# Patient Record
Sex: Female | Born: 1940 | Race: White | Hispanic: No | Marital: Married | Smoking: Never smoker
Health system: Southern US, Community
[De-identification: ages and names within clinical notes are randomized; demographics above are authoritative.]

## PROBLEM LIST (undated history)

## (undated) DIAGNOSIS — K219 Gastro-esophageal reflux disease without esophagitis: Secondary | ICD-10-CM

## (undated) DIAGNOSIS — E785 Hyperlipidemia, unspecified: Secondary | ICD-10-CM

## (undated) DIAGNOSIS — J42 Unspecified chronic bronchitis: Secondary | ICD-10-CM

## (undated) DIAGNOSIS — R519 Headache, unspecified: Secondary | ICD-10-CM

## (undated) DIAGNOSIS — M545 Low back pain, unspecified: Secondary | ICD-10-CM

## (undated) DIAGNOSIS — I00 Rheumatic fever without heart involvement: Secondary | ICD-10-CM

## (undated) DIAGNOSIS — J45909 Unspecified asthma, uncomplicated: Secondary | ICD-10-CM

## (undated) DIAGNOSIS — F419 Anxiety disorder, unspecified: Secondary | ICD-10-CM

## (undated) DIAGNOSIS — R51 Headache: Secondary | ICD-10-CM

## (undated) DIAGNOSIS — M1990 Unspecified osteoarthritis, unspecified site: Secondary | ICD-10-CM

## (undated) DIAGNOSIS — C189 Malignant neoplasm of colon, unspecified: Secondary | ICD-10-CM

## (undated) DIAGNOSIS — G8929 Other chronic pain: Secondary | ICD-10-CM

## (undated) DIAGNOSIS — I10 Essential (primary) hypertension: Secondary | ICD-10-CM

## (undated) HISTORY — DX: Rheumatic fever without heart involvement: I00

## (undated) HISTORY — PX: DILATION AND CURETTAGE OF UTERUS: SHX78

## (undated) HISTORY — DX: Hyperlipidemia, unspecified: E78.5

## (undated) HISTORY — PX: APPENDECTOMY: SHX54

## (undated) HISTORY — PX: TONSILLECTOMY: SUR1361

## (undated) HISTORY — DX: Essential (primary) hypertension: I10

## (undated) HISTORY — PX: COLONOSCOPY: SHX174

---

## 2002-09-21 HISTORY — PX: POLYPECTOMY: SHX149

## 2002-10-27 ENCOUNTER — Encounter (INDEPENDENT_AMBULATORY_CARE_PROVIDER_SITE_OTHER): Payer: Self-pay | Admitting: Specialist

## 2002-10-27 ENCOUNTER — Ambulatory Visit (HOSPITAL_COMMUNITY): Admission: RE | Admit: 2002-10-27 | Discharge: 2002-10-27 | Payer: Self-pay | Admitting: Gastroenterology

## 2003-03-03 ENCOUNTER — Emergency Department (HOSPITAL_COMMUNITY): Admission: EM | Admit: 2003-03-03 | Discharge: 2003-03-03 | Payer: Self-pay | Admitting: Emergency Medicine

## 2005-07-13 ENCOUNTER — Other Ambulatory Visit: Admission: RE | Admit: 2005-07-13 | Discharge: 2005-07-13 | Payer: Self-pay | Admitting: Family Medicine

## 2006-07-16 ENCOUNTER — Other Ambulatory Visit: Admission: RE | Admit: 2006-07-16 | Discharge: 2006-07-16 | Payer: Self-pay | Admitting: Family Medicine

## 2006-09-16 ENCOUNTER — Ambulatory Visit: Payer: Self-pay | Admitting: Gastroenterology

## 2006-09-28 ENCOUNTER — Ambulatory Visit: Payer: Self-pay | Admitting: Gastroenterology

## 2007-07-19 ENCOUNTER — Other Ambulatory Visit: Admission: RE | Admit: 2007-07-19 | Discharge: 2007-07-19 | Payer: Self-pay | Admitting: Family Medicine

## 2008-07-25 ENCOUNTER — Other Ambulatory Visit: Admission: RE | Admit: 2008-07-25 | Discharge: 2008-07-25 | Payer: Self-pay | Admitting: Family Medicine

## 2008-08-01 ENCOUNTER — Encounter: Admission: RE | Admit: 2008-08-01 | Discharge: 2008-08-01 | Payer: Self-pay | Admitting: Family Medicine

## 2008-09-06 ENCOUNTER — Encounter: Admission: RE | Admit: 2008-09-06 | Discharge: 2008-09-06 | Payer: Self-pay | Admitting: Family Medicine

## 2009-10-01 ENCOUNTER — Encounter (INDEPENDENT_AMBULATORY_CARE_PROVIDER_SITE_OTHER): Payer: Self-pay | Admitting: *Deleted

## 2009-10-28 ENCOUNTER — Encounter (INDEPENDENT_AMBULATORY_CARE_PROVIDER_SITE_OTHER): Payer: Self-pay | Admitting: *Deleted

## 2009-12-02 ENCOUNTER — Encounter (INDEPENDENT_AMBULATORY_CARE_PROVIDER_SITE_OTHER): Payer: Self-pay

## 2009-12-03 ENCOUNTER — Ambulatory Visit: Payer: Self-pay | Admitting: Gastroenterology

## 2009-12-17 ENCOUNTER — Ambulatory Visit: Payer: Self-pay | Admitting: Gastroenterology

## 2010-07-08 ENCOUNTER — Encounter: Admission: RE | Admit: 2010-07-08 | Discharge: 2010-07-08 | Payer: Self-pay | Admitting: Family Medicine

## 2010-09-01 ENCOUNTER — Other Ambulatory Visit
Admission: RE | Admit: 2010-09-01 | Discharge: 2010-09-01 | Payer: Self-pay | Source: Home / Self Care | Admitting: Family Medicine

## 2010-10-23 NOTE — Letter (Signed)
Summary: Colonoscopy Letter   Gastroenterology  [address] [place], [place] [address]   Phone: [phone]  Fax: [phone]      October 01, 2009 MRN: [id number]   [facility] [address] [place], [place]  [address]   Dear Ms. Westra,   According to your medical record, it is time for you to schedule a Colonoscopy. The American Cancer Society recommends this procedure as a method to detect early colon cancer. Patients with a family history of colon cancer, or a personal history of colon polyps or inflammatory bowel disease are at increased risk.  This letter has beeen generated based on the recommendations made at the time of your procedure. If you feel that in your particular situation this may no longer apply, please contact our office.  Please call our office at [phone] to schedule this appointment or to update your records at your earliest convenience.  Thank you for cooperating with [place] to provide you with the very best care possible.   Sincerely,  Judie Petit T. Russella Dar, M.D.  [facility] Gastroenterology Division [phone]

## 2010-10-23 NOTE — Procedures (Signed)
Summary: Colonoscopy  Patient: Kathryn Adams Note: All result statuses are Final unless otherwise noted.  Tests: (1) Colonoscopy (COL)   COL Colonoscopy           DONE     [facility]     [address]     [place], [place]  [address]           COLONOSCOPY PROCEDURE REPORT           PATIENT:  [name], [name]  MR#:  [id number]     BIRTHDATE:  09-May-1941, 68 yrs. old  GENDER:  female           ENDOSCOPIST:  Judie Petit T. 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 Screening.           MEDICATIONS:   Fentanyl 100 mcg IV, Versed 12 mg IV           DESCRIPTION OF PROCEDURE:   After the risks benefits and     alternatives of the procedure were thoroughly explained, informed     consent was obtained.  Digital rectal exam was performed and     revealed no abnormalities.   The LB [id number]-[id number] endoscope     was introduced through the anus and advanced to the cecum, which     was identified by both the appendix and ileocecal valve, limited     by a tortuous colon.  The quality of the prep was excellent, using     MoviPrep.  The instrument was then slowly withdrawn as the colon     was fully examined.     <<PROCEDUREIMAGES>>           FINDINGS:  A normal appearing cecum, ileocecal valve, and     appendiceal orifice were identified. The ascending, hepatic     flexure, transverse, splenic flexure, descending, sigmoid colon,     and rectum appeared unremarkable.  Retroflexed views in the rectum     revealed no abnormalities.  The time to cecum =  4.5  minutes. The     scope was then withdrawn (time =  10.75  min) from the patient and     the procedure completed.           COMPLICATIONS:  None           ENDOSCOPIC IMPRESSION:     1) Normal colon           RECOMMENDATIONS:     1) Repeat Colonoscopy in 3  years.           Venita Lick. Russella Dar, MD, [facility]           CC: Juluis Rainier, MD           n.     Rosalie DoctorVenita Lick. Jessamy Torosyan at 12/17/2009 09:04 AM           Merrie Roof, [phone]  Note: An exclamation mark (!) indicates a result that was not dispersed into the flowsheet. Document Creation Date: 12/17/2009 9:05 AM _______________________________________________________________________  (1) Order result status: Final Collection or observation date-time: 12/17/2009 08:57 Requested date-time:  Receipt date-time:  Reported date-time:  Referring Physician:   Ordering Physician: Claudette Head [phone]) Specimen Source:  Source: Kem Parkinson  Filler Order Number: [id number] Lab site:   Appended Document: Colonoscopy    Clinical Lists Changes  Observations: Added new observation of COLONNXTDUE: 11/2012 (12/17/2009 14:50)

## 2010-10-23 NOTE — Miscellaneous (Signed)
Summary: Lec previsit  Clinical Lists Changes  Medications: Added new medication of MOVIPREP 100 GM  SOLR (PEG-KCL-NACL-NASULF-NA ASC-C) As per prep instructions. - Signed Rx of MOVIPREP 100 GM  SOLR (PEG-KCL-NACL-NASULF-NA ASC-C) As per prep instructions.;  #1 x 0;  Signed;  Entered by: Ulis Rias RN;  Authorized by: Meryl Dare MD [facility];  Method used: Electronically to [id number]*, [address] [place], [place]  [address], Ph: [phone], Fax: [phone] Observations: Added new observation of NKA: T (12/03/2009 10:42)    Prescriptions: MOVIPREP 100 GM  SOLR (PEG-KCL-NACL-NASULF-NA ASC-C) As per prep instructions.  #1 x 0   Entered by:   Ulis Rias RN   Authorized by:   Meryl Dare MD [facility]   Signed by:   Ulis Rias RN on 12/03/2009   Method used:   Electronically to        [facility] [phone]* (retail)       [address] [place], [place]  [address]       Ph: [phone]       Fax: [phone]   RxID:   [id number]

## 2010-10-23 NOTE — Letter (Signed)
Summary: Previsit letter  [facility] Gastroenterology  [address] [place], [place] [address]   Phone: [phone]  Fax: [phone]       10/28/2009 MRN: [id number]  [facility] [address] [place], [place]  [address]  Dear Kathryn Adams,  Welcome to the Gastroenterology Division at [facility].    You are scheduled to see a nurse for your pre-procedure visit on 12-03-09 at 11:00a.m. on the 3rd floor at [facility], [address]  We ask that you try to arrive at our office 15 minutes prior to your appointment time to allow for check-in.  Your nurse visit will consist of discussing your medical and surgical history, your immediate family medical history, and your medications.    Please bring a complete list of all your medications or, if you prefer, bring the medication bottles and we will list them.  We will need to be aware of both prescribed and over the counter drugs.  We will need to know exact dosage information as well.  If you are on blood thinners (Coumadin, Plavix, Aggrenox, Ticlid, etc.) please call our office today/prior to your appointment, as we need to consult with your physician about holding your medication.   Please be prepared to read and sign documents such as consent forms, a financial agreement, and acknowledgement forms.  If necessary, and with your consent, a friend or relative is welcome to sit-in on the nurse visit with you.  Please bring your insurance card so that we may make a copy of it.  If your insurance requires a referral to see a specialist, please bring your referral form from your primary care physician.  No co-pay is required for this nurse visit.     If you cannot keep your appointment, please call [phone] to cancel or reschedule prior to your appointment date.  This allows [place] the opportunity to schedule an appointment for another patient in need of care.    Thank you for choosing  Gastroenterology for your  medical needs.  We appreciate the opportunity to care for you.  Please visit [place] at our website  to learn more about our practice.                     Sincerely.                                                                                                                   The Gastroenterology Division

## 2010-10-23 NOTE — Letter (Signed)
Summary: [facility] Instructions  [facility]  [address] [place], [place] [address]   Phone: [phone]  Fax: [phone]       Kathryn Adams    1941/02/04    MRN: [id number]        Procedure Day /Date:  12/17/09       Arrival Time: 7:30am     Procedure Time:  8:30am     Location of Procedure:                    _x _  [facility] (4th Floor)   PREPARATION FOR COLONOSCOPY WITH MOVIPREP   Starting 5 days prior to your procedure _ 3/24/11_ do not eat nuts, seeds, popcorn, corn, beans, peas,  salads, or any raw vegetables.  Do not take any fiber supplements (e.g. Metamucil, Citrucel, and Benefiber).  THE DAY BEFORE YOUR PROCEDURE         DATE:  12/16/09  DAY:  Monday  1.  Drink clear liquids the entire day-NO SOLID FOOD  2.  Do not drink anything colored red or purple.  Avoid juices with pulp.  No orange juice.  3.  Drink at least 64 oz. (8 glasses) of fluid/clear liquids during the day to prevent dehydration and help the prep work efficiently.  CLEAR LIQUIDS INCLUDE: Water Jello Ice Popsicles Tea (sugar ok, no milk/cream) Powdered fruit flavored drinks Coffee (sugar ok, no milk/cream) Gatorade Juice: apple, white grape, white cranberry  Lemonade Clear bullion, consomm, broth Carbonated beverages (any kind) Strained chicken noodle soup Hard Candy                             4.  In the morning, mix first dose of MoviPrep solution:    Empty 1 Pouch A and 1 Pouch B into the disposable container    Add lukewarm drinking water to the top line of the container. Mix to dissolve    Refrigerate (mixed solution should be used within 24 hrs)  5.  Begin drinking the prep at 5:00 p.m. The MoviPrep container is divided by 4 marks.   Every 15 minutes drink the solution down to the next mark (approximately 8 oz) until the full liter is complete.   6.  Follow completed prep with 16 oz of clear liquid of your choice (Nothing red or purple).   Continue to drink clear liquids until bedtime.  7.  Before going to bed, mix second dose of MoviPrep solution:    Empty 1 Pouch A and 1 Pouch B into the disposable container    Add lukewarm drinking water to the top line of the container. Mix to dissolve    Refrigerate  THE DAY OF YOUR PROCEDURE      DATE:  12/17/09  DAY:  Tuesday  Beginning at 3:30 a.m. (5 hours before procedure):         1. Every 15 minutes, drink the solution down to the next mark (approx 8 oz) until the full liter is complete.  2. Follow completed prep with 16 oz. of clear liquid of your choice.    3. You may drink clear liquids until  6:30am  (2 HOURS BEFORE PROCEDURE).   MEDICATION INSTRUCTIONS  Unless otherwise instructed, you should take regular prescription medications with a small sip of water   as early as possible the morning of your procedure.   Additional medication instructions: Do not take HCTZ the am  of procedure.         OTHER INSTRUCTIONS  You will need a responsible adult at least 71 years of age to accompany you and drive you home.   This person must remain in the waiting room during your procedure.  Wear loose fitting clothing that is easily removed.  Leave jewelry and other valuables at home.  However, you may wish to bring a book to read or  an iPod/MP3 player to listen to music as you wait for your procedure to start.  Remove all body piercing jewelry and leave at home.  Total time from sign-in until discharge is approximately 2-3 hours.  You should go home directly after your procedure and rest.  You can resume normal activities the  day after your procedure.  The day of your procedure you should not:   Drive   Make legal decisions   Operate machinery   Drink alcohol   Return to work  You will receive specific instructions about eating, activities and medications before you leave.    The above instructions have been reviewed and explained to me by   Ulis Rias RN  December 03, 2009 11:31 AM     I fully understand and can verbalize these instructions _____________________________ Date _________

## 2011-07-23 ENCOUNTER — Other Ambulatory Visit: Payer: Self-pay | Admitting: Family Medicine

## 2011-07-23 DIAGNOSIS — Z1231 Encounter for screening mammogram for malignant neoplasm of breast: Secondary | ICD-10-CM

## 2011-09-01 ENCOUNTER — Ambulatory Visit
Admission: RE | Admit: 2011-09-01 | Discharge: 2011-09-01 | Disposition: A | Discharge status: Home / Self Care | Payer: Self-pay | Source: Ambulatory Visit | Attending: Family Medicine | Admitting: Family Medicine

## 2011-09-01 DIAGNOSIS — Z1231 Encounter for screening mammogram for malignant neoplasm of breast: Secondary | ICD-10-CM

## 2012-12-19 ENCOUNTER — Other Ambulatory Visit: Payer: Self-pay | Admitting: Family Medicine

## 2012-12-19 DIAGNOSIS — Z1231 Encounter for screening mammogram for malignant neoplasm of breast: Secondary | ICD-10-CM

## 2012-12-27 ENCOUNTER — Ambulatory Visit (INDEPENDENT_AMBULATORY_CARE_PROVIDER_SITE_OTHER): Payer: Medicare Other

## 2012-12-27 DIAGNOSIS — Z1231 Encounter for screening mammogram for malignant neoplasm of breast: Secondary | ICD-10-CM

## 2012-12-29 ENCOUNTER — Encounter: Payer: Self-pay | Admitting: Gastroenterology

## 2013-02-07 ENCOUNTER — Encounter: Payer: Self-pay | Admitting: Gastroenterology

## 2013-03-29 ENCOUNTER — Ambulatory Visit (AMBULATORY_SURGERY_CENTER): Payer: Medicare Other | Admitting: *Deleted

## 2013-03-29 VITALS — Ht 68.0 in | Wt 196.4 lb

## 2013-03-29 DIAGNOSIS — Z85038 Personal history of other malignant neoplasm of large intestine: Secondary | ICD-10-CM

## 2013-03-29 MED ORDER — MOVIPREP 100 G PO SOLR: ORAL | Status: DC

## 2013-03-29 NOTE — Progress Notes (Signed)
Patient denies any allergies to eggs or soy. 

## 2013-03-30 ENCOUNTER — Encounter: Payer: Self-pay | Admitting: Gastroenterology

## 2013-04-12 ENCOUNTER — Encounter: Payer: Self-pay | Admitting: Gastroenterology

## 2013-04-12 ENCOUNTER — Ambulatory Visit (AMBULATORY_SURGERY_CENTER): Payer: Medicare Other | Admitting: Gastroenterology

## 2013-04-12 VITALS — BP 142/78 | HR 67 | Temp 96.9°F | Resp 13 | Ht 68.0 in | Wt 196.0 lb

## 2013-04-12 DIAGNOSIS — Z86010 Personal history of colonic polyps: Secondary | ICD-10-CM

## 2013-04-12 DIAGNOSIS — Z85038 Personal history of other malignant neoplasm of large intestine: Secondary | ICD-10-CM

## 2013-04-12 MED ORDER — SODIUM CHLORIDE 0.9 % IV SOLN: 500.0000 mL | INTRAVENOUS | Status: DC

## 2013-04-12 NOTE — Progress Notes (Signed)
Procedure ends, to recovery, report iven and VSS. 

## 2013-04-12 NOTE — Patient Instructions (Signed)
YOU HAD AN ENDOSCOPIC PROCEDURE TODAY AT THE [facility]: Refer to the procedure report that was given to you for any specific questions about what was found during the examination.  If the procedure report does not answer your questions, please call your gastroenterologist to clarify.  If you requested that your care partner not be given the details of your procedure findings, then the procedure report has been included in a sealed envelope for you to review at your convenience later.  YOU SHOULD EXPECT: Some feelings of bloating in the abdomen. Passage of more gas than usual.  Walking can help get rid of the air that was put into your GI tract during the procedure and reduce the bloating. If you had a lower endoscopy (such as a colonoscopy or flexible sigmoidoscopy) you may notice spotting of blood in your stool or on the toilet paper. If you underwent a bowel prep for your procedure, then you may not have a normal bowel movement for a few days.  DIET: Your first meal following the procedure should be a light meal and then it is ok to progress to your normal diet.  A half-sandwich or bowl of soup is an example of a good first meal.  Heavy or fried foods are harder to digest and may make you feel nauseous or bloated.  Likewise meals heavy in dairy and vegetables can cause extra gas to form and this can also increase the bloating.  Drink plenty of fluids but you should avoid alcoholic beverages for 24 hours.  ACTIVITY: Your care partner should take you home directly after the procedure.  You should plan to take it easy, moving slowly for the rest of the day.  You can resume normal activity the day after the procedure however you should NOT DRIVE or use heavy machinery for 24 hours (because of the sedation medicines used during the test).    SYMPTOMS TO REPORT IMMEDIATELY: A gastroenterologist can be reached at any hour.  During normal business hours, 8:30 AM to 5:00 PM Monday through Friday,  call [phone].  After hours and on weekends, please call the GI answering service at [phone] who will take a message and have the physician on call contact you.   Following lower endoscopy (colonoscopy or flexible sigmoidoscopy):  Excessive amounts of blood in the stool  Significant tenderness or worsening of abdominal pains  Swelling of the abdomen that is new, acute  Fever of 100F or higher    FOLLOW UP: If any biopsies were taken you will be contacted by phone or by letter within the next 1-3 weeks.  Call your gastroenterologist if you have not heard about the biopsies in 3 weeks.  Our staff will call the home number listed on your records the next business day following your procedure to check on you and address any questions or concerns that you may have at that time regarding the information given to you following your procedure. This is a courtesy call and so if there is no answer at the home number and we have not heard from you through the emergency physician on call, we will assume that you have returned to your regular daily activities without incident.  SIGNATURES/CONFIDENTIALITY: You and/or your care partner have signed paperwork which will be entered into your electronic medical record.  These signatures attest to the fact that that the information above on your After Visit Summary has been reviewed and is understood.  Full responsibility of the confidentiality   of this discharge information lies with you and/or your care-partner.     

## 2013-04-12 NOTE — Op Note (Signed)
[facility] [address] [place], [address]   COLONOSCOPY PROCEDURE REPORT  PATIENT: Kathryn Adams, Kathryn Adams  MR#: [id number] BIRTHDATE: 1941/04/26 , 71  yrs. old GENDER: Female ENDOSCOPIST: Meryl Dare, MD, [facility] PROCEDURE DATE:  04/12/2013 PROCEDURE:   Colonoscopy, screening ASA CLASS:   Class II INDICATIONS:High risk patient with personal history of colon cancer, Patient's personal history of adenomatous colon polyps, and follow-up of polyp/cancer, HGD/intramucosal. MEDICATIONS: MAC sedation, administered by CRNA and propofol (Diprivan) 260mg  IV DESCRIPTION OF PROCEDURE:   After the risks benefits and alternatives of the procedure were thoroughly explained, informed consent was obtained.  A digital rectal exam revealed no abnormalities of the rectum.   The LB [id number]  endoscope was introduced through the anus and advanced to the cecum, which was identified by both the appendix and ileocecal valve. No adverse events experienced.   Limited by a tortuous colon.   The quality of the prep was good, using MoviPrep  The instrument was then slowly withdrawn as the colon was fully examined.  COLON FINDINGS: A normal appearing cecum, ileocecal valve, and appendiceal orifice were identified.  The ascending, hepatic flexure, transverse, splenic flexure, descending, sigmoid colon and rectum appeared unremarkable.  No polyps or cancers were seen. There was a there was a narrow rectal vault.  Retroflexion was not performed due to a narrow rectal vault. The time to cecum=8 minutes 33 seconds.  Withdrawal time=8 minutes 53 seconds.  The scope was withdrawn and the procedure completed.  COMPLICATIONS: There were no complications.  ENDOSCOPIC IMPRESSION: 1.   Normal colon  RECOMMENDATIONS: 1.  Repeat Colonoscopy in 5 years.  eSigned:  Meryl Dare, MD, [facility] 04/12/2013 10:59 AM   cc: Juluis Rainier, MD

## 2013-04-12 NOTE — Progress Notes (Signed)
Patient did not experience any of the following events: a burn prior to discharge; a fall within the facility; wrong site/side/patient/procedure/implant event; or a hospital transfer or hospital admission upon discharge from the facility. (G8907) Patient did not have preoperative order for IV antibiotic SSI prophylaxis. (G8918)  

## 2013-04-14 ENCOUNTER — Telehealth: Payer: Self-pay | Admitting: *Deleted

## 2013-04-14 NOTE — Telephone Encounter (Signed)
Left message

## 2014-01-16 ENCOUNTER — Emergency Department (HOSPITAL_COMMUNITY): Payer: PRIVATE HEALTH INSURANCE

## 2014-01-16 ENCOUNTER — Inpatient Hospital Stay (HOSPITAL_COMMUNITY)
Admission: EM | Admit: 2014-01-16 | Discharge: 2014-01-19 | DRG: 287 | Disposition: A | Discharge status: Home / Self Care | Payer: PRIVATE HEALTH INSURANCE | Attending: Cardiovascular Disease | Admitting: Cardiovascular Disease

## 2014-01-16 ENCOUNTER — Encounter (HOSPITAL_COMMUNITY): Payer: Self-pay | Admitting: Emergency Medicine

## 2014-01-16 DIAGNOSIS — I10 Essential (primary) hypertension: Secondary | ICD-10-CM | POA: Diagnosis present

## 2014-01-16 DIAGNOSIS — I2510 Atherosclerotic heart disease of native coronary artery without angina pectoris: Secondary | ICD-10-CM | POA: Diagnosis present

## 2014-01-16 DIAGNOSIS — G43909 Migraine, unspecified, not intractable, without status migrainosus: Secondary | ICD-10-CM | POA: Diagnosis present

## 2014-01-16 DIAGNOSIS — E876 Hypokalemia: Secondary | ICD-10-CM | POA: Diagnosis present

## 2014-01-16 DIAGNOSIS — Z888 Allergy status to other drugs, medicaments and biological substances status: Secondary | ICD-10-CM | POA: Diagnosis not present

## 2014-01-16 DIAGNOSIS — I059 Rheumatic mitral valve disease, unspecified: Secondary | ICD-10-CM

## 2014-01-16 DIAGNOSIS — Z85038 Personal history of other malignant neoplasm of large intestine: Secondary | ICD-10-CM | POA: Diagnosis not present

## 2014-01-16 DIAGNOSIS — I4891 Unspecified atrial fibrillation: Principal | ICD-10-CM | POA: Diagnosis present

## 2014-01-16 DIAGNOSIS — E785 Hyperlipidemia, unspecified: Secondary | ICD-10-CM | POA: Diagnosis present

## 2014-01-16 DIAGNOSIS — Z881 Allergy status to other antibiotic agents status: Secondary | ICD-10-CM

## 2014-01-16 DIAGNOSIS — Z800 Family history of malignant neoplasm of digestive organs: Secondary | ICD-10-CM | POA: Diagnosis not present

## 2014-01-16 HISTORY — DX: Headache, unspecified: R51.9

## 2014-01-16 HISTORY — DX: Other chronic pain: G89.29

## 2014-01-16 HISTORY — DX: Headache: R51

## 2014-01-16 HISTORY — DX: Unspecified asthma, uncomplicated: J45.909

## 2014-01-16 HISTORY — DX: Unspecified chronic bronchitis: J42

## 2014-01-16 HISTORY — DX: Malignant neoplasm of colon, unspecified: C18.9

## 2014-01-16 HISTORY — DX: Low back pain, unspecified: M54.50

## 2014-01-16 HISTORY — DX: Gastro-esophageal reflux disease without esophagitis: K21.9

## 2014-01-16 HISTORY — DX: Anxiety disorder, unspecified: F41.9

## 2014-01-16 HISTORY — DX: Low back pain: M54.5

## 2014-01-16 HISTORY — DX: Unspecified osteoarthritis, unspecified site: M19.90

## 2014-01-16 LAB — CBC
HCT: 44.4 % (ref 36.0–46.0)
Hemoglobin: 14.9 g/dL (ref 12.0–15.0)
MCH: 30.7 pg (ref 26.0–34.0)
MCHC: 33.6 g/dL (ref 30.0–36.0)
MCV: 91.5 fL (ref 78.0–100.0)
Platelets: 242 10*3/uL (ref 150–400)
RBC: 4.85 MIL/uL (ref 3.87–5.11)
RDW: 12.7 % (ref 11.5–15.5)
WBC: 6.2 10*3/uL (ref 4.0–10.5)

## 2014-01-16 LAB — I-STAT TROPONIN, ED: Troponin i, poc: 0 ng/mL (ref 0.00–0.08)

## 2014-01-16 LAB — PRO B NATRIURETIC PEPTIDE: PRO B NATRI PEPTIDE: 1617 pg/mL — AB (ref 0–125)

## 2014-01-16 LAB — I-STAT CHEM 8, ED
BUN: 12 mg/dL (ref 6–23)
CHLORIDE: 105 meq/L (ref 96–112)
CREATININE: 0.8 mg/dL (ref 0.50–1.10)
Calcium, Ion: 1.16 mmol/L (ref 1.13–1.30)
Glucose, Bld: 115 mg/dL — ABNORMAL HIGH (ref 70–99)
HCT: 49 % — ABNORMAL HIGH (ref 36.0–46.0)
HEMOGLOBIN: 16.7 g/dL — AB (ref 12.0–15.0)
POTASSIUM: 3.6 meq/L — AB (ref 3.7–5.3)
Sodium: 144 mEq/L (ref 137–147)
TCO2: 24 mmol/L (ref 0–100)

## 2014-01-16 LAB — CREATININE, SERUM
CREATININE: 0.71 mg/dL (ref 0.50–1.10)
GFR calc Af Amer: >90 mL/min (ref >90)
GFR, EST NON AFRICAN AMERICAN: 84 mL/min — AB (ref >90)

## 2014-01-16 LAB — TROPONIN I
Troponin I: <0.3 ng/mL (ref <0.30)
Troponin I: <0.3 ng/mL (ref <0.30)

## 2014-01-16 MED ORDER — PANTOPRAZOLE SODIUM 40 MG PO TBEC
40.0000 mg | DELAYED_RELEASE_TABLET | Freq: Every day | ORAL | Status: DC
  Administered 2014-01-16 – 2014-01-17 (×2): 40 mg via ORAL
  Filled 2014-01-16: qty 1

## 2014-01-16 MED ORDER — ATORVASTATIN CALCIUM 40 MG PO TABS
40.0000 mg | ORAL_TABLET | Freq: Every day | ORAL | Status: DC
Start: 2014-01-16 — End: 2014-01-19
  Administered 2014-01-16 – 2014-01-18 (×3): 40 mg via ORAL
  Filled 2014-01-16 (×5): qty 1

## 2014-01-16 MED ORDER — ALUM & MAG HYDROXIDE-SIMETH 200-200-20 MG/5ML PO SUSP
30.0000 mL | Freq: Four times a day (QID) | ORAL | Status: DC | PRN
  Administered 2014-01-16: 30 mL via ORAL
  Filled 2014-01-16: qty 30

## 2014-01-16 MED ORDER — HEPARIN SODIUM (PORCINE) 5000 UNIT/ML IJ SOLN
5000.0000 [IU] | Freq: Three times a day (TID) | INTRAMUSCULAR | Status: DC
  Administered 2014-01-16 – 2014-01-19 (×8): 5000 [IU] via SUBCUTANEOUS
  Filled 2014-01-16 (×10): qty 1

## 2014-01-16 MED ORDER — POTASSIUM CHLORIDE CRYS ER 20 MEQ PO TBCR
20.0000 meq | EXTENDED_RELEASE_TABLET | Freq: Two times a day (BID) | ORAL | Status: DC
  Administered 2014-01-16 – 2014-01-17 (×3): 20 meq via ORAL
  Filled 2014-01-16 (×6): qty 1

## 2014-01-16 MED ORDER — METOPROLOL TARTRATE 50 MG PO TABS
50.0000 mg | ORAL_TABLET | Freq: Two times a day (BID) | ORAL | Status: DC
  Administered 2014-01-16 – 2014-01-19 (×6): 50 mg via ORAL
  Filled 2014-01-16 (×7): qty 1

## 2014-01-16 MED ORDER — ASPIRIN EC 81 MG PO TBEC
81.0000 mg | DELAYED_RELEASE_TABLET | Freq: Every day | ORAL | Status: DC
  Administered 2014-01-16 – 2014-01-19 (×4): 81 mg via ORAL
  Filled 2014-01-16 (×5): qty 1

## 2014-01-16 MED ORDER — HYDROCHLOROTHIAZIDE 25 MG PO TABS
25.0000 mg | ORAL_TABLET | Freq: Every day | ORAL | Status: DC
  Administered 2014-01-17: 25 mg via ORAL
  Filled 2014-01-16 (×2): qty 1

## 2014-01-16 MED ORDER — PANTOPRAZOLE SODIUM 40 MG PO TBEC: 40.0000 mg | DELAYED_RELEASE_TABLET | Freq: Every day | ORAL | Status: DC | PRN

## 2014-01-16 MED ORDER — SODIUM CHLORIDE 0.9 % IV SOLN: 250.0000 mL | INTRAVENOUS | Status: DC | PRN

## 2014-01-16 MED ORDER — SODIUM CHLORIDE 0.9 % IJ SOLN
3.0000 mL | Freq: Two times a day (BID) | INTRAMUSCULAR | Status: DC
  Administered 2014-01-16 – 2014-01-18 (×4): 3 mL via INTRAVENOUS

## 2014-01-16 MED ORDER — ADULT MULTIVITAMIN W/MINERALS CH
1.0000 | ORAL_TABLET | Freq: Every day | ORAL | Status: DC
  Administered 2014-01-16 – 2014-01-19 (×4): 1 via ORAL
  Filled 2014-01-16 (×4): qty 1

## 2014-01-16 MED ORDER — OMEPRAZOLE MAGNESIUM 20 MG PO TBEC: 20.0000 mg | DELAYED_RELEASE_TABLET | Freq: Every day | ORAL | Status: DC | PRN

## 2014-01-16 MED ORDER — ALPRAZOLAM 0.5 MG PO TABS: 0.5000 mg | ORAL_TABLET | Freq: Every evening | ORAL | Status: DC | PRN

## 2014-01-16 MED ORDER — PANTOPRAZOLE SODIUM 40 MG PO TBEC
40.0000 mg | DELAYED_RELEASE_TABLET | Freq: Every day | ORAL | Status: DC
  Filled 2014-01-16: qty 1

## 2014-01-16 MED ORDER — SODIUM CHLORIDE 0.9 % IJ SOLN: 3.0000 mL | INTRAMUSCULAR | Status: DC | PRN

## 2014-01-16 MED ORDER — SODIUM CHLORIDE 0.9 % IJ SOLN
3.0000 mL | Freq: Two times a day (BID) | INTRAMUSCULAR | Status: DC
  Administered 2014-01-16: 3 mL via INTRAVENOUS

## 2014-01-16 NOTE — ED Notes (Signed)
New onset afib from [place] x one week.

## 2014-01-16 NOTE — Progress Notes (Signed)
Pt. C/o abdominal gas pains. Dr. Jeanne Ivan, MD made aware. New orders received. RN will implement as ordered and continue to monitor pt. For changes in condition. Gillis Santa Atiana Levier

## 2014-01-16 NOTE — Progress Notes (Signed)
  Echocardiogram 2D Echocardiogram has been performed.  Kathryn Adams 01/16/2014, 3:41 PM

## 2014-01-16 NOTE — ED Notes (Signed)
MD at bedside. 

## 2014-01-16 NOTE — ED Provider Notes (Signed)
CSN: [id number]     Arrival date & time 01/16/14  1106 History   First MD Initiated Contact with Patient 01/16/14 1119     Chief Complaint  Patient presents with  . Atrial Fibrillation      HPI Patient presents with one-week history of shortness of breath.  Went to her primary care physician's this morning was found to have new onset atrial fibrillation.  Patient denies chest pain.  Has had increasing fatigue for same period of time. Past Medical History  Diagnosis Date  . Hypertension   . Rheumatic fever without heart involvement as child  . Hyperlipidemia   . Colon cancer     "polyp he removed was cover in cancer but not attached to the wall" (01/16/2014)  . Bronchial asthma   . Chronic bronchitis     "last 3 yrs" (01/16/2014)  . GERD (gastroesophageal reflux disease)   . Daily headache   . Arthritis     "little in my knees maybe" (01/16/2014)  . Chronic lower back pain   . Anxiety     "at times" (01/16/2014)   Past Surgical History  Procedure Laterality Date  . Appendectomy    . Tonsillectomy    . Colonoscopy  2004; 2005; 2008; 2011; 2014  . Polypectomy  2004    "found cancer"  . Dilation and curettage of uterus     Family History  Problem Relation Age of Onset  . Colon polyps Father   . Colon cancer Daughter    History  Substance Use Topics  . Smoking status: Never Smoker   . Smokeless tobacco: Never Used  . Alcohol Use: No   OB History   Grav Para Term Preterm Abortions TAB SAB Ect Mult Living                 Review of Systems  All other systems reviewed and are negative  Allergies  Ampicillin and Lisinopril  Home Medications   Prior to Admission medications   Medication Sig Start Date End Date Taking? Authorizing Provider  ALPRAZolam Duanne Moron) 0.5 MG tablet Take 0.5 mg by mouth as needed for sleep.    Historical Provider, MD  Aspirin-Acetaminophen-Caffeine (GOODY HEADACHE PO) Take 1 each by mouth as needed.    Historical Provider, MD   Chlorpheniramine-PSE-Ibuprofen (ADVIL ALLERGY SINUS PO) Take 1 tablet by mouth as needed.    Historical Provider, MD  Cholecalciferol (VITAMIN D PO) Take 1 tablet by mouth daily.    Historical Provider, MD  hydrochlorothiazide (HYDRODIURIL) 25 MG tablet Take 25 mg by mouth daily.    Historical Provider, MD  metoprolol succinate (TOPROL-XL) 50 MG 24 hr tablet Take 100 mg by mouth daily. Take with or immediately following a meal.    Historical Provider, MD  simvastatin (ZOCOR) 80 MG tablet Take 80 mg by mouth at bedtime.    Historical Provider, MD  VITAMIN E PO Take 1 tablet by mouth daily.    Historical Provider, MD   BP 110/58  Pulse 66  Temp(Src) 97.4 F (36.3 C) (Oral)  Resp 18  Ht 5\' 8"  (1.727 m)  Wt 204 lb (92.534 kg)  BMI 31.03 kg/m2  SpO2 96% Physical Exam  Nursing note and vitals reviewed. Constitutional: She is oriented to person, place, and time. She appears well-developed and well-nourished. No distress.  HENT:  Head: Normocephalic and atraumatic.  Eyes: Pupils are equal, round, and reactive to light.  Neck: Normal range of motion.  Cardiovascular: Normal rate and intact distal pulses.  An irregularly irregular rhythm present.  Pulmonary/Chest: No respiratory distress.  Abdominal: Normal appearance. She exhibits no distension.  Musculoskeletal: Normal range of motion.  Neurological: She is alert and oriented to person, place, and time. No cranial nerve deficit.  Skin: Skin is warm and dry. No rash noted.  Psychiatric: She has a normal mood and affect. Her behavior is normal.    ED Course  Procedures (including critical care time) Medications  heparin injection 5,000 Units (5,000 Units Subcutaneous Given 01/18/14 1345)  sodium chloride 0.9 % injection 3 mL (3 mLs Intravenous Not Given 01/18/14 1000)  sodium chloride 0.9 % injection 3 mL (0 mLs Intravenous Duplicate 4/97/02 6378)  sodium chloride 0.9 % injection 3 mL ( Intravenous MAR Unhold 01/17/14 1723)  0.9 %  sodium  chloride infusion ( Intravenous MAR Unhold 01/17/14 1723)  aspirin EC tablet 81 mg (81 mg Oral Given 01/18/14 1104)  multivitamin with minerals tablet 1 tablet (1 tablet Oral Given 01/18/14 1106)  ALPRAZolam (XANAX) tablet 0.5 mg ( Oral MAR Unhold 01/17/14 1723)  metoprolol tartrate (LOPRESSOR) tablet 50 mg (50 mg Oral Given 01/18/14 1106)  atorvastatin (LIPITOR) tablet 40 mg (40 mg Oral Given 01/17/14 2300)  alum & mag hydroxide-simeth (MAALOX/MYLANTA) 200-200-20 MG/5ML suspension 30 mL ( Oral MAR Unhold 01/17/14 1723)  HYDROcodone-acetaminophen (NORCO/VICODIN) 5-325 MG per tablet 1 tablet ( Oral MAR Unhold 01/17/14 1723)  acetaminophen (TYLENOL) tablet 650 mg (not administered)  ondansetron (ZOFRAN) injection 4 mg (4 mg Intravenous Given 01/18/14 1059)  0.9 %  sodium chloride infusion (0 mL/kg/hr  89.9 kg Intravenous Stopped 01/18/14 0104)  0.9 %  sodium chloride infusion ( Intravenous Rate/Dose Verify 01/18/14 1200)  pantoprazole (PROTONIX) injection 40 mg (40 mg Intravenous Given 01/18/14 1105)  diltiazem (CARDIZEM) tablet 30 mg (30 mg Oral Given 01/18/14 1345)  hydrochlorothiazide (MICROZIDE) capsule 12.5 mg (12.5 mg Oral Given 01/18/14 1103)  potassium chloride (K-DUR,KLOR-CON) CR tablet 10 mEq (10 mEq Oral Given 01/18/14 1345)  diazepam (VALIUM) tablet 5 mg (5 mg Oral Given 01/17/14 1528)  lidocaine (PF) (XYLOCAINE) 1 % injection (not administered)  heparin 2-0.9 UNIT/ML-% infusion (not administered)  nitroGLYCERIN (NTG ON-CALL) 0.2 mg/mL injection (not administered)  midazolam (VERSED) 2 MG/2ML injection (not administered)  fentaNYL (SUBLIMAZE) 0.05 MG/ML injection (not administered)  ondansetron (ZOFRAN) 4 MG/2ML injection (not administered)   Discuss case with cardiology who will come to emergency apartment for evaluation. Labs Review Labs Reviewed  PRO B NATRIURETIC PEPTIDE - Abnormal; Notable for the following:    Pro B Natriuretic peptide (BNP) 1617.0 (*)    All other components within  normal limits  CREATININE, SERUM - Abnormal; Notable for the following:    GFR calc non Af Amer 84 (*)    All other components within normal limits  BASIC METABOLIC PANEL - Abnormal; Notable for the following:    Potassium 3.6 (*)    Glucose, Bld 118 (*)    GFR calc non Af Amer 85 (*)    All other components within normal limits  CREATININE, SERUM - Abnormal; Notable for the following:    GFR calc non Af Amer 84 (*)    All other components within normal limits  BASIC METABOLIC PANEL - Abnormal; Notable for the following:    Glucose, Bld 124 (*)    GFR calc non Af Amer 86 (*)    All other components within normal limits  I-STAT CHEM 8, ED - Abnormal; Notable for the following:    Potassium 3.6 (*)  Glucose, Bld 115 (*)    Hemoglobin 16.7 (*)    HCT 49.0 (*)    All other components within normal limits  CBC  TROPONIN I  TROPONIN I  TROPONIN I  CBC  PROTIME-INR  CBC  PROTIME-INR  HEMOGLOBIN A1C  I-STAT TROPOININ, ED  POCT ACTIVATED CLOTTING TIME    Imaging Review No results found.   EKG Interpretation   Date/Time:  Tuesday January 16 2014 11:14:58 EDT Ventricular Rate:  97 PR Interval:    QRS Duration: 82 QT Interval:  355 QTC Calculation: 451 R Axis:   42 Text Interpretation:  Atrial fibrillation Ventricular premature complex  Minimal ST depression, diffuse leads No previous tracing Confirmed by  Audie Pinto  MD, Deshaun Schou ([id number]) on 01/16/2014 11:22:38 AM      MDM   Final diagnoses:  Atrial fibrillation        Dot Lanes, MD 01/18/14 1347

## 2014-01-16 NOTE — H&P (Signed)
Referring Physician:  [name] is an 73 y.o. female.                       Chief Complaint: New onset atrial fibrillation and weakness x 1 week. HPI: 73 years old female presents with one-week history of shortness of breath. She went to her primary care physician's office this morning and was found to have new onset atrial fibrillation. Patient denies chest pain. Has had increasing fatigue for same period of time.    Past Medical History  Diagnosis Date  . Hypertension   . Rheumatic fever without heart involvement as child  . Hyperlipidemia   . Personal history of colon cancer       Past Surgical History  Procedure Laterality Date  . Appendectomy    . Tonsillectomy    . Colonoscopy    . Polypectomy      Family History  Problem Relation Age of Onset  . Colon polyps Father   . Colon cancer Daughter    Social History:  reports that she has never smoked. She has never used smokeless tobacco. She reports that she does not drink alcohol or use illicit drugs.  Allergies:  Allergies  Allergen Reactions  . Ampicillin Nausea And Vomiting  . Lisinopril Cough     (Not in a hospital admission)  Results for orders placed during the hospital encounter of 01/16/14 (from the past 48 hour(s))  PRO B NATRIURETIC PEPTIDE     Status: Abnormal   Collection Time    01/16/14 11:43 AM      Result Value Ref Range   Pro B Natriuretic peptide (BNP) 1617.0 (*) 0 - 125 pg/mL  I-STAT TROPOININ, ED     Status: None   Collection Time    01/16/14 11:55 AM      Result Value Ref Range   Troponin i, poc 0.00  0.00 - 0.08 ng/mL   Comment 3            Comment: Due to the release kinetics of cTnI,     a negative result within the first hours     of the onset of symptoms does not rule out     myocardial infarction with certainty.     If myocardial infarction is still suspected,     repeat the test at appropriate intervals.  I-STAT CHEM 8, ED     Status: Abnormal   Collection Time   01/16/14 11:56 AM      Result Value Ref Range   Sodium 144  137 - 147 mEq/L   Potassium 3.6 (*) 3.7 - 5.3 mEq/L   Chloride 105  96 - 112 mEq/L   BUN 12  6 - 23 mg/dL   Creatinine, Ser 0.80  0.50 - 1.10 mg/dL   Glucose, Bld 115 (*) 70 - 99 mg/dL   Calcium, Ion 1.16  1.13 - 1.30 mmol/L   TCO2 24  0 - 100 mmol/L   Hemoglobin 16.7 (*) 12.0 - 15.0 g/dL   HCT 49.0 (*) 36.0 - 46.0 %   No results found.  Review of Systems  Constitutional: Positive for shortness of breath. Negative for fever.  Respiratory: Positive for shortness of breath. Negative for cough.  Cardiovascular: Positive for palpitations, orthopnea, leg swelling and PND.  Gastrointestinal: Negative for diarrhea and constipation.  Genitourinary: Negative for dysuria.  Musculoskeletal: Positive for back pain. Negative for falls.  Neurological: Positive for dizziness, weakness. Negative for loss of consciousness.  Blood pressure 145/84, pulse 97, temperature 98.3 F (36.8 C), temperature source Oral, resp. rate 17, height 5\' 8"  (1.727 m), weight 91.627 kg (202 lb), SpO2 100.00%.  Physical Exam  Nursing note and vitals reviewed.  Constitutional: She appears well-developed and well-nourished. No distress.  HENT: Head: Normocephalic and atraumatic. Eyes: Brown eyes, Pupils are equal, round, and reactive to light.  Neck: Normal range of motion.  Cardiovascular: Normal rate and intact distal pulses. An irregularly irregular rhythm present.  Pulmonary/Chest: No respiratory distress.  Abdominal: Normal appearance. She exhibits no distension.  Musculoskeletal: Normal range of motion.  Neurological: She is alert and oriented to person, place, and time. No cranial nerve deficit.  Skin: Skin is warm and dry. No rash noted.  Psychiatric: She has a normal mood and affect. Her behavior is normal.   Assessment/Plan Atrial fibrillation Shortness of breath Hyperlipidemia Hypokalemia Hypertension  Admit Nuclear stress test v/s  cardiac cath with EKG changes of ischemia.  Birdie Riddle 01/16/2014, 12:58 PM

## 2014-01-16 NOTE — Progress Notes (Signed)
Pts. HR elevated with activity. Pt. Asymptomatic. No distress or chest discomfort noted. RN will continue to monitor pt. For changes. Kathryn Adams

## 2014-01-17 ENCOUNTER — Encounter (HOSPITAL_COMMUNITY)
Admission: EM | Disposition: A | Discharge status: Home / Self Care | Payer: Self-pay | Source: Home / Self Care | Attending: Cardiovascular Disease

## 2014-01-17 HISTORY — PX: LEFT HEART CATHETERIZATION WITH CORONARY ANGIOGRAM: SHX5451

## 2014-01-17 LAB — CBC
HCT: 42.3 % (ref 36.0–46.0)
HCT: 42.2 % (ref 36.0–46.0)
Hemoglobin: 14.4 g/dL (ref 12.0–15.0)
Hemoglobin: 14.4 g/dL (ref 12.0–15.0)
MCH: 30.9 pg (ref 26.0–34.0)
MCH: 31.1 pg (ref 26.0–34.0)
MCHC: 34 g/dL (ref 30.0–36.0)
MCHC: 34.1 g/dL (ref 30.0–36.0)
MCV: 90.8 fL (ref 78.0–100.0)
MCV: 91.1 fL (ref 78.0–100.0)
PLATELETS: 221 10*3/uL (ref 150–400)
PLATELETS: 187 10*3/uL (ref 150–400)
RBC: 4.66 MIL/uL (ref 3.87–5.11)
RBC: 4.63 MIL/uL (ref 3.87–5.11)
RDW: 12.7 % (ref 11.5–15.5)
RDW: 12.8 % (ref 11.5–15.5)
WBC: 7.7 10*3/uL (ref 4.0–10.5)
WBC: 5.1 10*3/uL (ref 4.0–10.5)

## 2014-01-17 LAB — CREATININE, SERUM
Creatinine, Ser: 0.69 mg/dL (ref 0.50–1.10)
GFR calc non Af Amer: 84 mL/min — ABNORMAL LOW (ref >90)

## 2014-01-17 LAB — BASIC METABOLIC PANEL
BUN: 12 mg/dL (ref 6–23)
CALCIUM: 10 mg/dL (ref 8.4–10.5)
CO2: 26 meq/L (ref 19–32)
CREATININE: 0.67 mg/dL (ref 0.50–1.10)
Chloride: 101 mEq/L (ref 96–112)
GFR calc Af Amer: >90 mL/min (ref >90)
GFR calc non Af Amer: 85 mL/min — ABNORMAL LOW (ref >90)
Glucose, Bld: 118 mg/dL — ABNORMAL HIGH (ref 70–99)
Potassium: 3.6 mEq/L — ABNORMAL LOW (ref 3.7–5.3)
Sodium: 139 mEq/L (ref 137–147)

## 2014-01-17 LAB — TROPONIN I

## 2014-01-17 LAB — PROTIME-INR
INR: 0.96 (ref 0.00–1.49)
Prothrombin Time: 12.6 seconds (ref 11.6–15.2)

## 2014-01-17 LAB — POCT ACTIVATED CLOTTING TIME: Activated Clotting Time: 66 seconds

## 2014-01-17 SURGERY — LEFT HEART CATHETERIZATION WITH CORONARY ANGIOGRAM
Anesthesia: LOCAL

## 2014-01-17 MED ORDER — SODIUM CHLORIDE 0.9 % IJ SOLN: 3.0000 mL | INTRAMUSCULAR | Status: DC | PRN

## 2014-01-17 MED ORDER — SODIUM CHLORIDE 0.9 % IJ SOLN: 3.0000 mL | Freq: Two times a day (BID) | INTRAMUSCULAR | Status: DC

## 2014-01-17 MED ORDER — ACETAMINOPHEN 325 MG PO TABS: 650.0000 mg | ORAL_TABLET | ORAL | Status: DC | PRN

## 2014-01-17 MED ORDER — FENTANYL CITRATE 0.05 MG/ML IJ SOLN
INTRAMUSCULAR | Status: AC
  Filled 2014-01-17: qty 2

## 2014-01-17 MED ORDER — HYDROCODONE-ACETAMINOPHEN 5-325 MG PO TABS
1.0000 | ORAL_TABLET | Freq: Four times a day (QID) | ORAL | Status: DC | PRN
  Administered 2014-01-17 – 2014-01-18 (×3): 1 via ORAL
  Filled 2014-01-17 (×2): qty 1
  Filled 2014-01-17: qty 2

## 2014-01-17 MED ORDER — MIDAZOLAM HCL 2 MG/2ML IJ SOLN
INTRAMUSCULAR | Status: AC
  Filled 2014-01-17: qty 2

## 2014-01-17 MED ORDER — NITROGLYCERIN 0.2 MG/ML ON CALL CATH LAB
INTRAVENOUS | Status: AC
  Filled 2014-01-17: qty 1

## 2014-01-17 MED ORDER — ONDANSETRON HCL 4 MG/2ML IJ SOLN
4.0000 mg | Freq: Four times a day (QID) | INTRAMUSCULAR | Status: DC | PRN
  Administered 2014-01-17 – 2014-01-18 (×3): 4 mg via INTRAVENOUS
  Filled 2014-01-17 (×4): qty 2

## 2014-01-17 MED ORDER — SODIUM CHLORIDE 0.9 % IV SOLN: 250.0000 mL | INTRAVENOUS | Status: DC | PRN

## 2014-01-17 MED ORDER — DILTIAZEM HCL 60 MG PO TABS
60.0000 mg | ORAL_TABLET | Freq: Four times a day (QID) | ORAL | Status: DC
  Administered 2014-01-17: 60 mg via ORAL
  Filled 2014-01-17 (×6): qty 1

## 2014-01-17 MED ORDER — HEPARIN (PORCINE) IN NACL 2-0.9 UNIT/ML-% IJ SOLN
INTRAMUSCULAR | Status: AC
  Filled 2014-01-17: qty 1500

## 2014-01-17 MED ORDER — DIAZEPAM 5 MG PO TABS
5.0000 mg | ORAL_TABLET | ORAL | Status: AC
  Administered 2014-01-17: 5 mg via ORAL
  Filled 2014-01-17: qty 1

## 2014-01-17 MED ORDER — LIDOCAINE HCL (PF) 1 % IJ SOLN
INTRAMUSCULAR | Status: AC
  Filled 2014-01-17: qty 30

## 2014-01-17 MED ORDER — SODIUM CHLORIDE 0.9 % IV SOLN
INTRAVENOUS | Status: DC
  Administered 2014-01-17: 12:00:00 via INTRAVENOUS

## 2014-01-17 MED ORDER — ONDANSETRON HCL 4 MG/2ML IJ SOLN
INTRAMUSCULAR | Status: AC
  Filled 2014-01-17: qty 2

## 2014-01-17 MED ORDER — SODIUM CHLORIDE 0.9 % IV SOLN
1.0000 mL/kg/h | INTRAVENOUS | Status: AC
  Administered 2014-01-17: 1 mL/kg/h via INTRAVENOUS

## 2014-01-17 NOTE — Progress Notes (Signed)
Pt. C/o headache. Stated she normally has headaches when she wakes in the mornings. Dr. Doylene Canard made aware. New orders received and implemented. RN will continue to monitor for changes in condition. Kathryn Adams

## 2014-01-17 NOTE — CV Procedure (Signed)
PROCEDURE:  Left heart catheterization with selective coronary angiography, left ventriculogram.  CLINICAL HISTORY:  This is a 73 year old female with chest heaviness and EKG changes of ischemia.  The risks, benefits, and details of the procedure were explained to the patient.  The patient verbalized understanding and wanted to proceed.  Informed written consent was obtained.  PROCEDURE TECHNIQUE:  The patient was approached from the right femoral artery using a 5 French short sheath.  Left coronary angiography was done using a Judkins L4 guide catheter.  Right coronary angiography was done using a Judkins R4 guide catheter.  Left ventriculography was done using a pigtail catheter.    CONTRAST:  Total of 65 cc.  COMPLICATIONS:  Nausea requiring IV zofran use. And 250 cc saline bolus was given for LVEDP of 8.  At the end of the procedure a manual pressure was used for hemostasis.    HEMODYNAMICS:  Aortic pressure was 136/85; LV pressure was 134/3; LVEDP 8.  There was no gradient between the left ventricle and aorta.    ANGIOGRAM/CORONARY ARTERIOGRAM:   The left main coronary artery is unremarkable.  The left anterior descending artery has osteal concentric 20% stenosis and post diagonal 1 origin is relatively narrow vessel. Diagonal 1 has osteal 20 % stenosis.  The left circumflex artery is codominant and has proximal luminal irregularities. The large ramus branch has osteal 20 % stenosis improving with IC 200 mcg NTG injection. OM1, OM2 and PDA are unremarkable.  The right coronary artery is co-dominant and has proximal luminal irregularities. Posterolateral and PDA are unremarkable.  LEFT VENTRICULOGRAM:  Left ventricular angiogram was done in the 30 RAO projection and revealed normal left ventricular wall motion and systolic function with an estimated ejection fraction of 65 %.  LVEDP was 8 mmHg.  IMPRESSION OF HEART CATHETERIZATION:   1. Normal left main coronary artery. 2. 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 3. Mild disease of left circumflex artery and its branches. 4. Mild disease of right coronary artery. 5. Normal left ventricular systolic function.  LVEDP 8 mmHg.  Ejection fraction 65 %.  RECOMMENDATION:   Medical treatment.

## 2014-01-17 NOTE — Progress Notes (Signed)
Patient to cardiac lab.

## 2014-01-17 NOTE — Interval H&P Note (Signed)
History and Physical Interval Note:  01/17/2014 3:52 PM  Kathryn Adams  has presented today for surgery, with the diagnosis of cp  The various methods of treatment have been discussed with the patient and family. After consideration of risks, benefits and other options for treatment, the patient has consented to  Procedure(s): LEFT HEART CATHETERIZATION WITH CORONARY ANGIOGRAM (N/A) as a surgical intervention .  The patient's history has been reviewed, patient examined, no change in status, stable for surgery.  I have reviewed the patient's chart and labs.  Questions were answered to the patient's satisfaction.     Birdie Riddle

## 2014-01-18 ENCOUNTER — Encounter (HOSPITAL_COMMUNITY): Payer: Self-pay | Admitting: Certified Registered"

## 2014-01-18 ENCOUNTER — Encounter (HOSPITAL_COMMUNITY)
Admission: EM | Disposition: A | Discharge status: Home / Self Care | Payer: Self-pay | Source: Home / Self Care | Attending: Cardiovascular Disease

## 2014-01-18 LAB — HEMOGLOBIN A1C
Hgb A1c MFr Bld: 5.7 % — ABNORMAL HIGH (ref <5.7)
Mean Plasma Glucose: 117 mg/dL — ABNORMAL HIGH (ref <117)

## 2014-01-18 LAB — BASIC METABOLIC PANEL
BUN: 12 mg/dL (ref 6–23)
CALCIUM: 8.9 mg/dL (ref 8.4–10.5)
CO2: 24 mEq/L (ref 19–32)
Chloride: 102 mEq/L (ref 96–112)
Creatinine, Ser: 0.64 mg/dL (ref 0.50–1.10)
GFR calc Af Amer: >90 mL/min (ref >90)
GFR calc non Af Amer: 86 mL/min — ABNORMAL LOW (ref >90)
Glucose, Bld: 124 mg/dL — ABNORMAL HIGH (ref 70–99)
Potassium: 4 mEq/L (ref 3.7–5.3)
Sodium: 139 mEq/L (ref 137–147)

## 2014-01-18 LAB — PROTIME-INR
INR: 1.03 (ref 0.00–1.49)
PROTHROMBIN TIME: 13.3 s (ref 11.6–15.2)

## 2014-01-18 SURGERY — ECHOCARDIOGRAM, TRANSESOPHAGEAL
Anesthesia: Moderate Sedation

## 2014-01-18 MED ORDER — SODIUM CHLORIDE 0.9 % IV SOLN: INTRAVENOUS | Status: DC

## 2014-01-18 MED ORDER — SUMATRIPTAN SUCCINATE 6 MG/0.5ML ~~LOC~~ SOLN
6.0000 mg | Freq: Once | SUBCUTANEOUS | Status: AC
  Administered 2014-01-18: 6 mg via SUBCUTANEOUS
  Filled 2014-01-18: qty 0.5

## 2014-01-18 MED ORDER — HYDROCHLOROTHIAZIDE 12.5 MG PO CAPS
12.5000 mg | ORAL_CAPSULE | Freq: Every day | ORAL | Status: DC
  Administered 2014-01-18 – 2014-01-19 (×2): 12.5 mg via ORAL
  Filled 2014-01-18 (×2): qty 1

## 2014-01-18 MED ORDER — PANTOPRAZOLE SODIUM 40 MG IV SOLR
40.0000 mg | INTRAVENOUS | Status: DC
  Administered 2014-01-18: 40 mg via INTRAVENOUS
  Filled 2014-01-18 (×2): qty 40

## 2014-01-18 MED ORDER — POTASSIUM CHLORIDE CRYS ER 10 MEQ PO TBCR
10.0000 meq | EXTENDED_RELEASE_TABLET | Freq: Two times a day (BID) | ORAL | Status: DC
  Administered 2014-01-18 – 2014-01-19 (×3): 10 meq via ORAL
  Filled 2014-01-18 (×4): qty 1

## 2014-01-18 MED ORDER — GI COCKTAIL ~~LOC~~
30.0000 mL | Freq: Three times a day (TID) | ORAL | Status: DC | PRN
  Administered 2014-01-18: 30 mL via ORAL
  Filled 2014-01-18: qty 30

## 2014-01-18 MED ORDER — DILTIAZEM HCL 30 MG PO TABS
30.0000 mg | ORAL_TABLET | Freq: Three times a day (TID) | ORAL | Status: DC
  Administered 2014-01-18 – 2014-01-19 (×4): 30 mg via ORAL
  Filled 2014-01-18 (×7): qty 1

## 2014-01-18 NOTE — Progress Notes (Signed)
Patient evaluated for community based chronic disease management services with [facility] as a benefit of patient's [employer]. Spoke with patient and spouse, Gracelin Weisberg [phone] at bedside to explain [facility] services.  Written consents obtained and spouse is her authorized contact.  Patient is new onset atrial fibrillation and has requested disease process education.  Patient will receive a post discharge transition of care call and will be evaluated for monthly home visits for assessments and disease process education.  Left contact information and [facility] literature at bedside. Made Inpatient Case Manager aware that [facility] following. Of note, [facility] Care Management services does not replace or interfere with any services that are arranged by inpatient case management or social work.  For additional questions or referrals please contact Corliss Blacker BSN RN [facility] Liaison at [phone].

## 2014-01-18 NOTE — Progress Notes (Addendum)
Ref: Gerrit Heck, MD   Subjective:  Feeling nauseated. Converted to sinus rhythm earlier in the day. TEE canceled.  Objective:  Vital Signs in the last 24 hours: Temp:  [97.2 F (36.2 C)-98.9 F (37.2 C)] 98.9 F (37.2 C) (04/30 1425) Pulse Rate:  [61-102] 61 (04/30 1425) Cardiac Rhythm:  [-] Normal sinus rhythm;Other (Comment) (04/30 0900) Resp:  [18] 18 (04/30 1425) BP: (110-130)/(58-87) 125/62 mmHg (04/30 1425) SpO2:  [96 %-99 %] 96 % (04/30 1425) Weight:  [92.534 kg (204 lb)] 92.534 kg (204 lb) (04/30 0414)  Physical Exam: BP Readings from Last 1 Encounters:  01/18/14 125/62    Wt Readings from Last 1 Encounters:  01/18/14 92.534 kg (204 lb)    Weight change: 0.907 kg (2 lb)  HEENT: [place]/AT, Eyes- PERL, EOMI, Conjunctiva-Pink, Sclera-Non-icteric Neck: No JVD, No bruit, Trachea midline. Lungs:  Clear, Bilateral. Cardiac:  Regular rhythm, normal S1 and S2, no S3.  Abdomen:  Soft, non-tender. Extremities:  No edema present. No cyanosis. No clubbing. CNS: AxOx3, Cranial nerves grossly intact, moves all 4 extremities. Right handed. Skin: Warm and dry.   Intake/Output from previous day: 04/29 0701 - 04/30 0700 In: 243 [P.O.:240; I.V.:3] Out: 1025 [Urine:1025]    Lab Results: BMET    Component Value Date/Time   NA 139 01/18/2014 0204   NA 139 01/17/2014 0117   NA 144 01/16/2014 1156   K 4.0 01/18/2014 0204   K 3.6* 01/17/2014 0117   K 3.6* 01/16/2014 1156   CL 102 01/18/2014 0204   CL 101 01/17/2014 0117   CL 105 01/16/2014 1156   CO2 24 01/18/2014 0204   CO2 26 01/17/2014 0117   GLUCOSE 124* 01/18/2014 0204   GLUCOSE 118* 01/17/2014 0117   GLUCOSE 115* 01/16/2014 1156   BUN 12 01/18/2014 0204   BUN 12 01/17/2014 0117   BUN 12 01/16/2014 1156   CREATININE 0.64 01/18/2014 0204   CREATININE 0.69 01/17/2014 1920   CREATININE 0.67 01/17/2014 0117   CALCIUM 8.9 01/18/2014 0204   CALCIUM 10.0 01/17/2014 0117   GFRNONAA 86* 01/18/2014 0204   GFRNONAA 84* 01/17/2014 1920    GFRNONAA 85* 01/17/2014 0117   GFRAA >90 01/18/2014 0204   GFRAA >90 01/17/2014 1920   GFRAA >90 01/17/2014 0117   CBC    Component Value Date/Time   WBC 5.1 01/17/2014 1920   RBC 4.63 01/17/2014 1920   HGB 14.4 01/17/2014 1920   HCT 42.2 01/17/2014 1920   PLT 187 01/17/2014 1920   MCV 91.1 01/17/2014 1920   MCH 31.1 01/17/2014 1920   MCHC 34.1 01/17/2014 1920   RDW 12.8 01/17/2014 1920   HEPATIC Function Panel No results found for this basename: PROT,  ALBUMIN,  AST,  ALT,  ALKPHOS,  BILIDIR,  IBILI,  in the last 8760 hours HEMOGLOBIN A1C No components found with this basename: HGA1C,  MPG   CARDIAC ENZYMES Lab Results  Component Value Date   TROPONINI <0.30 01/17/2014   TROPONINI <0.30 01/16/2014   TROPONINI <0.30 01/16/2014   BNP  Recent Labs  01/16/14 1143  PROBNP 1617.0*   TSH No results found for this basename: TSH,  in the last 8760 hours CHOLESTEROL No results found for this basename: CHOL,  in the last 8760 hours  Scheduled Meds: . aspirin EC  81 mg Oral Daily  . atorvastatin  40 mg Oral q1800  . diltiazem  30 mg Oral 3 times per day  . heparin  5,000 Units Subcutaneous 3 times per  day  . hydrochlorothiazide  12.5 mg Oral Daily  . metoprolol  50 mg Oral BID  . multivitamin with minerals  1 tablet Oral Daily  . pantoprazole (PROTONIX) IV  40 mg Intravenous Q24H  . potassium chloride  10 mEq Oral BID  . sodium chloride  3 mL Intravenous Q12H  . sodium chloride  3 mL Intravenous Q12H   Continuous Infusions: . sodium chloride 20 mL/hr at 01/18/14 1200   PRN Meds:.sodium chloride, acetaminophen, ALPRAZolam, alum & mag hydroxide-simeth, HYDROcodone-acetaminophen, ondansetron (ZOFRAN) IV, sodium chloride  Assessment/Plan: Atrial fibrillation  Shortness of breath  Hyperlipidemia  Hypokalemia  Hypertension Mild CAD Headache and nausea, possible migraine  GI cocktail and sumatriptan one dose   LOS: 2 days    Dixie Dials  MD  01/18/2014, 6:10  PM     Feeling

## 2014-01-19 MED ORDER — ASPIRIN 81 MG PO TBEC: 81.0000 mg | DELAYED_RELEASE_TABLET | Freq: Every day | ORAL | Status: DC

## 2014-01-19 MED ORDER — DILTIAZEM HCL 60 MG PO TABS
60.0000 mg | ORAL_TABLET | Freq: Once | ORAL | Status: AC
  Administered 2014-01-19: 60 mg via ORAL
  Filled 2014-01-19: qty 1

## 2014-01-19 MED ORDER — HYDROCHLOROTHIAZIDE 25 MG PO TABS: 12.5000 mg | ORAL_TABLET | Freq: Every day | ORAL | Status: AC

## 2014-01-19 MED ORDER — DILTIAZEM HCL 60 MG PO TABS: 60.0000 mg | ORAL_TABLET | Freq: Two times a day (BID) | ORAL | Status: AC

## 2014-01-19 MED ORDER — SUMATRIPTAN SUCCINATE 50 MG PO TABS
50.0000 mg | ORAL_TABLET | ORAL | Status: DC | PRN
  Filled 2014-01-19: qty 1

## 2014-01-19 MED ORDER — APIXABAN 5 MG PO TABS: 5.0000 mg | ORAL_TABLET | Freq: Two times a day (BID) | ORAL | Status: AC

## 2014-01-19 MED ORDER — SUMATRIPTAN SUCCINATE 50 MG PO TABS: 50.0000 mg | ORAL_TABLET | ORAL | Status: AC | PRN

## 2014-01-19 MED ORDER — SIMVASTATIN 80 MG PO TABS: 40.0000 mg | ORAL_TABLET | Freq: Every day | ORAL | Status: AC

## 2014-01-19 NOTE — Care Management Note (Signed)
    Page 1 of 1   01/19/2014     11:36:35 AM CARE MANAGEMENT NOTE 01/19/2014  Patient:  Kathryn Adams, Kathryn Adams   Account Number:  [id number]  Date Initiated:  01/19/2014  Documentation initiated by:  [facility]  Subjective/Objective Assessment:   73 years old female presents with one-week history of shortness of breath. She went to her primary care physician's office this morning and was found to have new onset atrial fibrillation. //Home with spouse     Action/Plan:   Admit; Nuclear stress test v/s cardiac cath with EKG changes of ischemia.//Acess for Home Health Services   Anticipated DC Date:  01/21/2014   Anticipated DC Plan:  [facility]  CM consult      [facility] Choice  HOME HEALTH   Choice offered to / List presented to:  C-1 Patient        [facility] arranged  HH-1 RN  [place]   Status of service:  In process, will continue to follow Medicare Important Message given?  YES (If response is "NO", the following Medicare IM given date fields will be blank) Date Medicare IM given:  01/19/2014 Date Additional Medicare IM given:  01/19/2014  Discharge Disposition:    Per UR Regulation:  Reviewed for med. necessity/level of care/duration of stay  If discussed at [place] of Stay Meetings, dates discussed:    Comments:  01/19/14 [place], [place], [place], [employer] [phone] Spoke with pt at bedside regarding discharge planning for [facility]. Offered pt list of home health agencies to choose from.  Pt chose [facility] to render services. Rhett Bannister of [facility] notified.  No DME needs identified at this time.

## 2014-01-19 NOTE — Discharge Summary (Signed)
Physician Discharge Summary  Patient ID: Kathryn Adams MRN: [id number] DOB/AGE: [birth year] 74 y.o.  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 She when to her primary care physician's office before admission and was found to have new onset atrial fibrillation she denied any chest pain but had increasing fatigue for the same period of time. She underwent cardiac catheterization that showed  mild multivessel native vessel coronary artery disease. She converted to sinus rhythm with a small dose of diltiazem.  She developed migraine headaches that responded one dose of IV sumatriptan.  She was started on Eliquis 5 mg. one twice daily. She will also take diltiazem 60 mg. one twice daily. Her HCTZ was reduced by 50 % for hypokalemia. She will be followed by me and primary care physician Dr. Drema Dallas in about 1 week.  Consults: cardiology  Significant Diagnostic Studies: labs: Near normal except mild hypokalemia and mild hyperglycemia.   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  Chest X-ray showed: 1. Very mild vascular congestion without edema. 2. Stable borderline cardiomegaly. 3. Aortic atherosclerosis.  EKG-Atrial fibrillation on admission to Sinus rhythm on 01/18/2014  Echocardiogram : Left ventricle: The cavity size was normal. There was mild concentric hypertrophy. Systolic function was normal. The estimated ejection fraction was in the range of 60% to 65%. Wall motion was  normal; there were no regional wall motion abnormalities. The study is not technically sufficient to allow evaluation of LV diastolic function. - Aortic valve: Sclerosis without stenosis. No regurgitation. - Mitral valve: Calcified annulus. Mildly thickened leaflets. Mild regurgitation. - Left atrium: Moderately dilated (37 ml/m2). - Right atrium: The atrium was mildly dilated. - Systemic veins: IVC was not visualized. - Pericardium, extracardiac: There was no pericardial effusion.  Treatments: cardiac meds: metoprolol and diltiazem. Eliquis.  Discharge Exam: Blood pressure 111/70, pulse 92, temperature 97.6 F (36.4 C), temperature source Oral, resp. rate 18, height 5\' 8"  (1.727 m), weight 90.084 kg (198 lb 9.6 oz), SpO2 96.00%.  HEENT: Blades/AT, Eyes- PERL, EOMI, Conjunctiva-Pink, Sclera-Non-icteric  Neck: No JVD, No bruit, Trachea midline.  Lungs: Clear, Bilateral.  Cardiac: Regular rhythm, normal S1 and S2, no S3. II/VI systolic murmur. Abdomen: Soft, non-tender.  Extremities: No edema present. No cyanosis. No clubbing.  CNS: AxOx3, Cranial nerves grossly intact, moves all 4 extremities. Right handed.  Skin: Warm and dry  Disposition: 01, Home or self care     Medication List    STOP taking these medications       GOODY HEADACHE PO      TAKE these medications       albuterol 108 (90 BASE) MCG/ACT inhaler  Commonly known as:  PROVENTIL HFA;VENTOLIN HFA  Inhale 2 puffs into the lungs every 6 (six) hours as needed for wheezing or shortness of breath.     ALPRAZolam 0.5 MG tablet  Commonly known as:  XANAX  Take 0.5 mg by mouth at bedtime as needed for anxiety or sleep.     apixaban 5 MG Tabs tablet  Commonly known as:  ELIQUIS  Take 1 tablet (  5 mg total) by mouth 2 (two) times daily.     aspirin 81 MG EC tablet  Take 1 tablet (81 mg total) by mouth daily.     cholecalciferol 1000 UNITS tablet  Commonly known as:  VITAMIN D  Take 1,000-2,000 Units by mouth daily.  Alternates between taking 1 capsule daily, and 2 capsules every other day.     diltiazem 60 MG tablet  Commonly known as:  CARDIZEM  Take 1 tablet (60 mg total) by mouth 2 (two) times daily.     hydrochlorothiazide 25 MG tablet  Commonly known as:  HYDRODIURIL  Take 0.5 tablets (12.5 mg total) by mouth daily.     metoprolol 50 MG tablet  Commonly known as:  LOPRESSOR  Take 50 mg by mouth 2 (two) times daily.     omeprazole 20 MG tablet  Commonly known as:  PRILOSEC OTC  Take 20 mg by mouth daily as needed (for acid reflux).     simvastatin 80 MG tablet  Commonly known as:  ZOCOR  Take 0.5 tablets (40 mg total) by mouth at bedtime.     SUMAtriptan 50 MG tablet  Commonly known as:  IMITREX  Take 1 tablet (50 mg total) by mouth every 2 (two) hours as needed for migraine or headache. May repeat in 2 hours if headache persists or recurs.     vitamin E 400 UNIT capsule  Generic drug:  vitamin E  Take 400 Units by mouth daily.           Follow-up Information   Follow up with Gerrit Heck, MD On 01/26/2014. (@4 :30 pm arrive at 4:15pm to get checked in spoke with [facility])    Specialty:  Family Medicine   Contact information:   [place] [address] [phone]       Follow up with [facility] S, MD. Schedule an appointment as soon as possible for a visit on 01/25/2014. (@11 :30 am spoke with [facility])    Specialty:  Cardiology   Contact information:   [place] [address] [phone]       Signed: Birdie Riddle 01/19/2014, 10:49 AM

## 2014-02-05 ENCOUNTER — Other Ambulatory Visit: Payer: Self-pay | Admitting: Family Medicine

## 2014-02-05 DIAGNOSIS — Z1231 Encounter for screening mammogram for malignant neoplasm of breast: Secondary | ICD-10-CM

## 2014-02-13 ENCOUNTER — Ambulatory Visit (INDEPENDENT_AMBULATORY_CARE_PROVIDER_SITE_OTHER): Payer: Medicare HMO

## 2014-02-13 DIAGNOSIS — Z1231 Encounter for screening mammogram for malignant neoplasm of breast: Secondary | ICD-10-CM

## 2014-03-14 ENCOUNTER — Encounter (HOSPITAL_COMMUNITY): Payer: Self-pay | Admitting: Emergency Medicine

## 2014-03-14 ENCOUNTER — Emergency Department (HOSPITAL_COMMUNITY)
Admission: EM | Admit: 2014-03-14 | Discharge: 2014-03-15 | Disposition: A | Discharge status: Home / Self Care | Payer: Medicare HMO | Attending: Emergency Medicine | Admitting: Emergency Medicine

## 2014-03-14 DIAGNOSIS — Z7901 Long term (current) use of anticoagulants: Secondary | ICD-10-CM | POA: Insufficient documentation

## 2014-03-14 DIAGNOSIS — J45909 Unspecified asthma, uncomplicated: Secondary | ICD-10-CM | POA: Insufficient documentation

## 2014-03-14 DIAGNOSIS — F411 Generalized anxiety disorder: Secondary | ICD-10-CM | POA: Insufficient documentation

## 2014-03-14 DIAGNOSIS — R0609 Other forms of dyspnea: Secondary | ICD-10-CM | POA: Insufficient documentation

## 2014-03-14 DIAGNOSIS — E785 Hyperlipidemia, unspecified: Secondary | ICD-10-CM | POA: Insufficient documentation

## 2014-03-14 DIAGNOSIS — M545 Low back pain, unspecified: Secondary | ICD-10-CM | POA: Insufficient documentation

## 2014-03-14 DIAGNOSIS — G8929 Other chronic pain: Secondary | ICD-10-CM | POA: Insufficient documentation

## 2014-03-14 DIAGNOSIS — Z85038 Personal history of other malignant neoplasm of large intestine: Secondary | ICD-10-CM | POA: Insufficient documentation

## 2014-03-14 DIAGNOSIS — K219 Gastro-esophageal reflux disease without esophagitis: Secondary | ICD-10-CM | POA: Insufficient documentation

## 2014-03-14 DIAGNOSIS — Z7982 Long term (current) use of aspirin: Secondary | ICD-10-CM | POA: Insufficient documentation

## 2014-03-14 DIAGNOSIS — I4891 Unspecified atrial fibrillation: Secondary | ICD-10-CM | POA: Insufficient documentation

## 2014-03-14 DIAGNOSIS — Z79899 Other long term (current) drug therapy: Secondary | ICD-10-CM | POA: Insufficient documentation

## 2014-03-14 DIAGNOSIS — R51 Headache: Secondary | ICD-10-CM | POA: Insufficient documentation

## 2014-03-14 DIAGNOSIS — M129 Arthropathy, unspecified: Secondary | ICD-10-CM | POA: Insufficient documentation

## 2014-03-14 DIAGNOSIS — R0989 Other specified symptoms and signs involving the circulatory and respiratory systems: Secondary | ICD-10-CM | POA: Insufficient documentation

## 2014-03-14 DIAGNOSIS — I10 Essential (primary) hypertension: Secondary | ICD-10-CM | POA: Insufficient documentation

## 2014-03-14 NOTE — ED Notes (Signed)
Dr. Doy Mince at the bedside.

## 2014-03-14 NOTE — ED Notes (Addendum)
Pt reports that tonight she has gradually worsening chest pressure with palpitations. States she feels dizzy, lightheaded, SOB and weak. Pt noted to be anxious in triage. Pt noted to be in A Fib RVR rate 108-120's.  Hx: A fib.

## 2014-03-15 ENCOUNTER — Emergency Department (HOSPITAL_COMMUNITY): Payer: Medicare HMO

## 2014-03-15 LAB — CBC
HEMATOCRIT: 43.4 % (ref 36.0–46.0)
HEMOGLOBIN: 14.4 g/dL (ref 12.0–15.0)
MCH: 30.9 pg (ref 26.0–34.0)
MCHC: 33.2 g/dL (ref 30.0–36.0)
MCV: 93.1 fL (ref 78.0–100.0)
Platelets: 221 10*3/uL (ref 150–400)
RBC: 4.66 MIL/uL (ref 3.87–5.11)
RDW: 13.2 % (ref 11.5–15.5)
WBC: 5.8 10*3/uL (ref 4.0–10.5)

## 2014-03-15 LAB — BASIC METABOLIC PANEL
BUN: 14 mg/dL (ref 6–23)
CHLORIDE: 102 meq/L (ref 96–112)
CO2: 26 mEq/L (ref 19–32)
Calcium: 10.1 mg/dL (ref 8.4–10.5)
Creatinine, Ser: 0.72 mg/dL (ref 0.50–1.10)
GFR calc non Af Amer: 83 mL/min — ABNORMAL LOW (ref >90)
GLUCOSE: 132 mg/dL — AB (ref 70–99)
Potassium: 3.9 mEq/L (ref 3.7–5.3)
Sodium: 144 mEq/L (ref 137–147)

## 2014-03-15 LAB — PRO B NATRIURETIC PEPTIDE: PRO B NATRI PEPTIDE: 930 pg/mL — AB (ref 0–125)

## 2014-03-15 LAB — TROPONIN I: Troponin I: <0.3 ng/mL (ref <0.30)

## 2014-03-15 LAB — I-STAT TROPONIN, ED: Troponin i, poc: 0.01 ng/mL (ref 0.00–0.08)

## 2014-03-15 MED ORDER — ASPIRIN 81 MG PO CHEW
324.0000 mg | CHEWABLE_TABLET | Freq: Once | ORAL | Status: AC
  Administered 2014-03-15: 324 mg via ORAL
  Filled 2014-03-15: qty 4

## 2014-03-15 NOTE — ED Provider Notes (Signed)
CSN: [id number]     Arrival date & time 03/14/14  2313 History   First MD Initiated Contact with Patient 03/15/14 0008     Chief Complaint  Patient presents with  . Chest Pain  . Atrial Fibrillation     (Consider location/radiation/quality/duration/timing/severity/associated sxs/prior Treatment) Patient is a 73 y.o. female presenting with palpitations.  Palpitations Palpitations quality:  Irregular Onset quality:  Sudden Duration: several hours. Timing:  Constant Progression:  Unchanged Chronicity:  Recurrent Context comment:  Hx of a fib Relieved by:  Nothing Worsened by:  Nothing tried Ineffective treatments: regular meds this evening including diltiazem and metoprolol. Associated symptoms: shortness of breath   Associated symptoms: no chest pain, no diaphoresis and no nausea     Past Medical History  Diagnosis Date  . Hypertension   . Rheumatic fever without heart involvement as child  . Hyperlipidemia   . Colon cancer     "polyp he removed was cover in cancer but not attached to the wall" (01/16/2014)  . Bronchial asthma   . Chronic bronchitis     "last 3 yrs" (01/16/2014)  . GERD (gastroesophageal reflux disease)   . Daily headache   . Arthritis     "little in my knees maybe" (01/16/2014)  . Chronic lower back pain   . Anxiety     "at times" (01/16/2014)   Past Surgical History  Procedure Laterality Date  . Appendectomy    . Tonsillectomy    . Colonoscopy  2004; 2005; 2008; 2011; 2014  . Polypectomy  2004    "found cancer"  . Dilation and curettage of uterus     Family History  Problem Relation Age of Onset  . Colon polyps Father   . Colon cancer Daughter    History  Substance Use Topics  . Smoking status: Never Smoker   . Smokeless tobacco: Never Used  . Alcohol Use: No   OB History   Grav Para Term Preterm Abortions TAB SAB Ect Mult Living                 Review of Systems  Constitutional: Negative for diaphoresis.  Respiratory: Positive for  shortness of breath.   Cardiovascular: Positive for palpitations. Negative for chest pain.  Gastrointestinal: Negative for nausea.  All other systems reviewed and are negative.     Allergies  Ampicillin and Lisinopril  Home Medications   Prior to Admission medications   Medication Sig Start Date End Date Taking? Authorizing Thelbert Gartin  albuterol (PROVENTIL HFA;VENTOLIN HFA) 108 (90 BASE) MCG/ACT inhaler Inhale 2 puffs into the lungs every 6 (six) hours as needed for wheezing or shortness of breath.    Historical Tauren Delbuono, MD  ALPRAZolam Duanne Moron) 0.5 MG tablet Take 0.5 mg by mouth at bedtime as needed for anxiety or sleep.    Historical Umer Harig, MD  apixaban (ELIQUIS) 5 MG TABS tablet Take 1 tablet (5 mg total) by mouth 2 (two) times daily. 01/19/14   Birdie Riddle, MD  aspirin EC 81 MG EC tablet Take 1 tablet (81 mg total) by mouth daily. 01/19/14   Birdie Riddle, MD  cholecalciferol (VITAMIN D) 1000 UNITS tablet Take 1,000-2,000 Units by mouth daily. Alternates between taking 1 capsule daily, and 2 capsules every other day.    Historical Jersee Winiarski, MD  diltiazem (CARDIZEM) 60 MG tablet Take 1 tablet (60 mg total) by mouth 2 (two) times daily. 01/19/14   Birdie Riddle, MD  hydrochlorothiazide (HYDRODIURIL) 25 MG tablet Take 0.5 tablets (12.5  mg total) by mouth daily. 01/19/14   Birdie Riddle, MD  metoprolol (LOPRESSOR) 50 MG tablet Take 50 mg by mouth 2 (two) times daily.    Historical Brylen Wagar, MD  omeprazole (PRILOSEC OTC) 20 MG tablet Take 20 mg by mouth daily as needed (for acid reflux).    Historical Asharia Lotter, MD  simvastatin (ZOCOR) 80 MG tablet Take 0.5 tablets (40 mg total) by mouth at bedtime. 01/19/14   Birdie Riddle, MD  SUMAtriptan (IMITREX) 50 MG tablet Take 1 tablet (50 mg total) by mouth every 2 (two) hours as needed for migraine or headache. May repeat in 2 hours if headache persists or recurs. 01/19/14   Birdie Riddle, MD  vitamin E (VITAMIN E) 400 UNIT capsule Take 400 Units by  mouth daily.    Historical Lucia Mccreadie, MD   BP 183/91  Pulse 104  Temp(Src) 98.4 F (36.9 C) (Oral)  Resp 22  Ht 5\' 8"  (1.727 m)  Wt 196 lb (88.905 kg)  BMI 29.81 kg/m2  SpO2 99% Physical Exam  Nursing note and vitals reviewed. Constitutional: She is oriented to person, place, and time. She appears well-developed and well-nourished. No distress.  HENT:  Head: Normocephalic and atraumatic.  Mouth/Throat: Oropharynx is clear and moist.  Eyes: Conjunctivae are normal. Pupils are equal, round, and reactive to light. No scleral icterus.  Neck: Neck supple.  Cardiovascular: Normal rate, normal heart sounds and intact distal pulses.  An irregularly irregular rhythm present.  No murmur heard. Pulmonary/Chest: Effort normal and breath sounds normal. No stridor. No respiratory distress. She has no rales.  Abdominal: Soft. Bowel sounds are normal. She exhibits no distension. There is no tenderness.  Musculoskeletal: Normal range of motion.  Neurological: She is alert and oriented to person, place, and time.  Skin: Skin is warm and dry. No rash noted.  Psychiatric: She has a normal mood and affect. Her behavior is normal.    ED Course  Procedures (including critical care time) Labs Review Labs Reviewed  BASIC METABOLIC PANEL - Abnormal; Notable for the following:    Glucose, Bld 132 (*)    GFR calc non Af Amer 83 (*)    All other components within normal limits  PRO B NATRIURETIC PEPTIDE - Abnormal; Notable for the following:    Pro B Natriuretic peptide (BNP) 930.0 (*)    All other components within normal limits  CBC  TROPONIN I  Randolm Idol, ED    Imaging Review Dg Chest Port 1 View  03/15/2014   CLINICAL DATA:  Atrial fibrillation.  EXAM: PORTABLE CHEST - 1 VIEW  COMPARISON:  01/16/2014  FINDINGS: Mild cardiac enlargement without vascular congestion. No focal airspace disease or consolidation. No blunting of costophrenic angles. Tortuous and calcified aorta.  IMPRESSION:  Mild cardiac enlargement.  No evidence of active pulmonary disease.   Electronically Signed   By: Lucienne Capers M.D.   On: 03/15/2014 00:52  All radiology studies independently viewed by me.         EKG Interpretation   Date/Time:  Thursday March 15 2014 01:41:38 EDT Ventricular Rate:  73 PR Interval:    QRS Duration: 92 QT Interval:  415 QTC Calculation: 457 R Axis:   18 Text Interpretation:  Atrial fibrillation Minimal ST depression, inferior  leads No significant change was found Confirmed by [facility]  MD, TREY  ([id number]) on 03/15/2014 2:32:42 AM      MDM   Final diagnoses:  Atrial fibrillation, unspecified    73 yo  female with new diagnosis of a fib (2 months ago) presenting with palpitations.  She is also concerned that her blood pressures seem to be more elevated today (as high as 702O systolic at home).  She also reports to some elevated heart rates (as high as 125 at home).  No CP, no nausea, no diaphoresis.  Does complain of a little shortness of breath.    Observed in the ED for some time.  Symptoms improved.  Heart rate normalized.  Pt's palpitations are likely secondary to her known a fib for which she takes Eliquis.  Rate well controlled.  Labs unremarkable.  Ambulated without difficulty.  She was discharged home with cardiology follow up.  Return precautions given.    Houston Siren III, MD 03/15/14 [phone]

## 2014-03-15 NOTE — ED Notes (Signed)
Dr. Doy Mince at the bedside to discuss results. Preparing for discharge.

## 2014-03-15 NOTE — ED Notes (Signed)
Clarified with Dr. Doy Mince, repeat EKG.

## 2014-03-15 NOTE — ED Notes (Signed)
Walked troponin sample to mini-lab.

## 2014-03-15 NOTE — Discharge Instructions (Signed)
Atrial Fibrillation  Atrial fibrillation is a type of irregular heart rhythm (arrhythmia). During atrial fibrillation, the upper chambers of the heart (atria) quiver continuously in a chaotic pattern. This causes an irregular and often rapid heart rate.   Atrial fibrillation is the result of the heart becoming overloaded with disorganized signals that tell it to beat. These signals are normally released one at a time by a part of the right atrium called the sinoatrial node. They then travel from the atria to the lower chambers of the heart (ventricles), causing the atria and ventricles to contract and pump blood as they pass. In atrial fibrillation, parts of the atria outside of the sinoatrial node also release these signals. This results in two problems. First, the atria receive so many signals that they do not have time to fully contract. Second, the ventricles, which can only receive one signal at a time, beat irregularly and out of rhythm with the atria.   There are three types of atrial fibrillation:   · Paroxysmal. Paroxysmal atrial fibrillation starts suddenly and stops on its own within a week.  · Persistent. Persistent atrial fibrillation lasts for more than a week. It may stop on its own or with treatment.  · Permanent. Permanent atrial fibrillation does not go away. Episodes of atrial fibrillation may lead to permanent atrial fibrillation.  Atrial fibrillation can prevent your heart from pumping blood normally. It increases your risk of stroke and can lead to heart failure.   CAUSES   · Heart conditions, including a heart attack, heart failure, coronary artery disease, and heart valve conditions.    · Inflammation of the sac that surrounds the heart (pericarditis).  · Blockage of an artery in the lungs (pulmonary embolism).  · Pneumonia or other infections.  · Chronic lung disease.  · Thyroid problems, especially if the thyroid is overactive (hyperthyroidism).  · Caffeine, excessive alcohol use, and use  of some illegal drugs.    · Use of some medicines, including certain decongestants and diet pills.  · Heart surgery.    · Birth defects.    Sometimes, no cause can be found. When this happens, the atrial fibrillation is called lone atrial fibrillation. The risk of complications from atrial fibrillation increases if you have lone atrial fibrillation and you are age 60 years or older.  RISK FACTORS  · Heart failure.  · Coronary artery disease.  · Diabetes mellitus.    · High blood pressure (hypertension).    · Obesity.    · Other arrhythmias.    · Increased age.  SYMPTOMS   · A feeling that your heart is beating rapidly or irregularly.    · A feeling of discomfort or pain in your chest.    · Shortness of breath.    · Sudden light-headedness or weakness.    · Getting tired easily when exercising.    · Urinating more often than normal (mainly when atrial fibrillation first begins).    In paroxysmal atrial fibrillation, symptoms may start and suddenly stop.  DIAGNOSIS   Your health care provider may be able to detect atrial fibrillation when taking your pulse. Your health care provider may have you take a test called an ambulatory electrocardiogram (ECG). An ECG records your heartbeat patterns over a 24-hour period. You may also have other tests, such as:  · Transthoracic echocardiogram (TTE). During echocardiography, sound waves are used to evaluate how blood flows through your heart.  · Transesophageal echocardiogram (TEE).  · Stress test. There is more than one type of stress test. If a stress test is   needed, ask your health care provider about which type is best for you.    · Chest X-ray exam.  · Blood tests.  · Computed tomography (CT).  TREATMENT   Treatment may include:  · Treating any underlying conditions. For example, if you have an overactive thyroid, treating the condition may correct atrial fibrillation.  · Taking medicine. Medicines may be given to control a rapid heart rate or to prevent blood clots, heart  failure, or a stroke.  · Having a procedure to correct the rhythm of the heart:  ¨ Electrical cardioversion. During electrical cardioversion, a controlled, low-energy shock is delivered to the heart through your skin. If you have chest pain, very low blood pressure, or sudden heart failure, this procedure may need to be done as an emergency.  ¨ Catheter ablation. During this procedure, heart tissues that send the signals that cause atrial fibrillation are destroyed.  ¨ Maze or minimaze procedure. During this surgery, thin lines of heart tissue that carry the abnormal signals are destroyed. The maze procedure is an open-heart surgery. The minimaze procedure is a minimally invasive surgery. This means that small cuts are made to access the heart instead of a large opening.  ¨ Pulmonary venous isolation. During this surgery, tissue around the veins that carry blood from the lungs (pulmonary veins) is destroyed. This tissue is thought to carry the abnormal signals.  HOME CARE INSTRUCTIONS   · Only take medicines that your health care provider approves, and take them as directed. Some medicines can make atrial fibrillation worse or recur.  · If blood thinners were prescribed by your health care provider, take them exactly as directed. Too much blood-thinning medicine can cause bleeding. If you take too little, you will not have the needed protection against stroke and other problems.  · Perform blood tests at home if directed by your health care provider. Perform blood tests exactly as directed.  · Quit smoking if you smoke.  · Do not drink alcohol.  · Do not drink caffeinated beverages such as coffee, soda, and some teas. You may drink decaffeinated coffee, soda, or tea.    · Maintain a healthy weight. Do not use diet pills unless your health care provider approves. They may make heart problems worse.    · Follow diet instructions as directed by your health care provider.  · Exercise regularly as directed by your health  care provider.  · Keep all follow-up appointments with your health care provider.  PREVENTION   The following substances can cause atrial fibrillation to recur:   · Caffeinated beverages.  · Alcohol.  · Certain medicines, especially those used for breathing problems.  · Certain herbs and herbal medicines, such as those containing ephedra or ginseng.  · Illegal drugs, such as cocaine and amphetamines.  Sometimes medicines are given to prevent atrial fibrillation from recurring. Proper treatment of any underlying condition is also important in helping prevent recurrence.   SEEK MEDICAL CARE IF:  · You notice a change in the rate, rhythm, or strength of your heartbeat.  · You suddenly begin urinating more frequently.  · You tire more easily when exerting yourself or exercising.  SEEK IMMEDIATE MEDICAL CARE IF:   · You have chest pain, abdominal pain, sweating, or weakness.  · You feel nauseous.  · You have shortness of breath.  · You suddenly have swollen feet and ankles.  · You feel dizzy.  · Your face or limbs feel numb or weak.  · You have a change

## 2014-08-30 ENCOUNTER — Encounter (HOSPITAL_COMMUNITY): Payer: Self-pay | Admitting: Cardiovascular Disease

## 2014-10-04 ENCOUNTER — Encounter (HOSPITAL_COMMUNITY): Payer: Self-pay | Admitting: Cardiovascular Disease

## 2014-10-10 DIAGNOSIS — I2510 Atherosclerotic heart disease of native coronary artery without angina pectoris: Secondary | ICD-10-CM | POA: Diagnosis not present

## 2014-10-10 DIAGNOSIS — I480 Paroxysmal atrial fibrillation: Secondary | ICD-10-CM | POA: Diagnosis not present

## 2014-10-10 DIAGNOSIS — I10 Essential (primary) hypertension: Secondary | ICD-10-CM | POA: Diagnosis not present

## 2014-10-18 DIAGNOSIS — E348 Other specified endocrine disorders: Secondary | ICD-10-CM | POA: Diagnosis not present

## 2014-10-18 DIAGNOSIS — G9389 Other specified disorders of brain: Secondary | ICD-10-CM | POA: Diagnosis not present

## 2014-10-22 DIAGNOSIS — R51 Headache: Secondary | ICD-10-CM | POA: Diagnosis not present

## 2014-10-29 DIAGNOSIS — E780 Pure hypercholesterolemia: Secondary | ICD-10-CM | POA: Diagnosis not present

## 2014-10-29 DIAGNOSIS — M8580 Other specified disorders of bone density and structure, unspecified site: Secondary | ICD-10-CM | POA: Diagnosis not present

## 2014-10-29 DIAGNOSIS — I10 Essential (primary) hypertension: Secondary | ICD-10-CM | POA: Diagnosis not present

## 2014-10-29 DIAGNOSIS — K219 Gastro-esophageal reflux disease without esophagitis: Secondary | ICD-10-CM | POA: Diagnosis not present

## 2014-10-29 DIAGNOSIS — F419 Anxiety disorder, unspecified: Secondary | ICD-10-CM | POA: Diagnosis not present

## 2014-10-29 DIAGNOSIS — E559 Vitamin D deficiency, unspecified: Secondary | ICD-10-CM | POA: Diagnosis not present

## 2014-10-29 DIAGNOSIS — R7301 Impaired fasting glucose: Secondary | ICD-10-CM | POA: Diagnosis not present

## 2014-10-29 DIAGNOSIS — Z1389 Encounter for screening for other disorder: Secondary | ICD-10-CM | POA: Diagnosis not present

## 2014-11-16 DIAGNOSIS — H3531 Nonexudative age-related macular degeneration: Secondary | ICD-10-CM | POA: Diagnosis not present

## 2014-12-20 DIAGNOSIS — G43709 Chronic migraine without aura, not intractable, without status migrainosus: Secondary | ICD-10-CM | POA: Diagnosis not present

## 2015-01-09 DIAGNOSIS — M79604 Pain in right leg: Secondary | ICD-10-CM | POA: Diagnosis not present

## 2015-01-09 DIAGNOSIS — R51 Headache: Secondary | ICD-10-CM | POA: Diagnosis not present

## 2015-01-09 DIAGNOSIS — D329 Benign neoplasm of meninges, unspecified: Secondary | ICD-10-CM | POA: Diagnosis not present

## 2015-02-04 ENCOUNTER — Other Ambulatory Visit: Payer: Self-pay | Admitting: Family Medicine

## 2015-02-04 DIAGNOSIS — Z1231 Encounter for screening mammogram for malignant neoplasm of breast: Secondary | ICD-10-CM

## 2015-03-12 DIAGNOSIS — I2510 Atherosclerotic heart disease of native coronary artery without angina pectoris: Secondary | ICD-10-CM | POA: Diagnosis not present

## 2015-03-12 DIAGNOSIS — I10 Essential (primary) hypertension: Secondary | ICD-10-CM | POA: Diagnosis not present

## 2015-03-12 DIAGNOSIS — E784 Other hyperlipidemia: Secondary | ICD-10-CM | POA: Diagnosis not present

## 2015-03-12 DIAGNOSIS — I4589 Other specified conduction disorders: Secondary | ICD-10-CM | POA: Diagnosis not present

## 2015-03-12 DIAGNOSIS — I480 Paroxysmal atrial fibrillation: Secondary | ICD-10-CM | POA: Diagnosis not present

## 2015-03-13 ENCOUNTER — Ambulatory Visit (INDEPENDENT_AMBULATORY_CARE_PROVIDER_SITE_OTHER): Payer: Commercial Managed Care - HMO

## 2015-03-13 DIAGNOSIS — Z1231 Encounter for screening mammogram for malignant neoplasm of breast: Secondary | ICD-10-CM | POA: Diagnosis not present

## 2015-05-01 DIAGNOSIS — Z0000 Encounter for general adult medical examination without abnormal findings: Secondary | ICD-10-CM | POA: Diagnosis not present

## 2015-05-01 DIAGNOSIS — E559 Vitamin D deficiency, unspecified: Secondary | ICD-10-CM | POA: Diagnosis not present

## 2015-05-01 DIAGNOSIS — R7301 Impaired fasting glucose: Secondary | ICD-10-CM | POA: Diagnosis not present

## 2015-05-01 DIAGNOSIS — I10 Essential (primary) hypertension: Secondary | ICD-10-CM | POA: Diagnosis not present

## 2015-05-01 DIAGNOSIS — M8588 Other specified disorders of bone density and structure, other site: Secondary | ICD-10-CM | POA: Diagnosis not present

## 2015-05-01 DIAGNOSIS — E780 Pure hypercholesterolemia: Secondary | ICD-10-CM | POA: Diagnosis not present

## 2015-05-01 DIAGNOSIS — Z1389 Encounter for screening for other disorder: Secondary | ICD-10-CM | POA: Diagnosis not present

## 2015-05-07 ENCOUNTER — Other Ambulatory Visit: Payer: Self-pay | Admitting: Family Medicine

## 2015-05-07 DIAGNOSIS — M8580 Other specified disorders of bone density and structure, unspecified site: Secondary | ICD-10-CM

## 2015-06-04 ENCOUNTER — Other Ambulatory Visit: Payer: Self-pay | Admitting: Family Medicine

## 2015-06-04 DIAGNOSIS — M8580 Other specified disorders of bone density and structure, unspecified site: Secondary | ICD-10-CM

## 2015-06-13 DIAGNOSIS — H3531 Nonexudative age-related macular degeneration: Secondary | ICD-10-CM | POA: Diagnosis not present

## 2015-07-24 DIAGNOSIS — I10 Essential (primary) hypertension: Secondary | ICD-10-CM | POA: Diagnosis not present

## 2015-07-24 DIAGNOSIS — I482 Chronic atrial fibrillation: Secondary | ICD-10-CM | POA: Diagnosis not present

## 2015-07-24 DIAGNOSIS — E784 Other hyperlipidemia: Secondary | ICD-10-CM | POA: Diagnosis not present

## 2015-07-24 DIAGNOSIS — I2510 Atherosclerotic heart disease of native coronary artery without angina pectoris: Secondary | ICD-10-CM | POA: Diagnosis not present

## 2015-07-29 DIAGNOSIS — Z23 Encounter for immunization: Secondary | ICD-10-CM | POA: Diagnosis not present

## 2015-10-10 DIAGNOSIS — I340 Nonrheumatic mitral (valve) insufficiency: Secondary | ICD-10-CM | POA: Diagnosis not present

## 2015-10-10 DIAGNOSIS — I482 Chronic atrial fibrillation: Secondary | ICD-10-CM | POA: Diagnosis not present

## 2015-10-10 DIAGNOSIS — I2510 Atherosclerotic heart disease of native coronary artery without angina pectoris: Secondary | ICD-10-CM | POA: Diagnosis not present

## 2015-10-10 DIAGNOSIS — I361 Nonrheumatic tricuspid (valve) insufficiency: Secondary | ICD-10-CM | POA: Diagnosis not present

## 2015-11-01 DIAGNOSIS — E559 Vitamin D deficiency, unspecified: Secondary | ICD-10-CM | POA: Diagnosis not present

## 2015-11-01 DIAGNOSIS — K219 Gastro-esophageal reflux disease without esophagitis: Secondary | ICD-10-CM | POA: Diagnosis not present

## 2015-11-01 DIAGNOSIS — I4891 Unspecified atrial fibrillation: Secondary | ICD-10-CM | POA: Diagnosis not present

## 2015-11-01 DIAGNOSIS — I10 Essential (primary) hypertension: Secondary | ICD-10-CM | POA: Diagnosis not present

## 2015-11-01 DIAGNOSIS — F419 Anxiety disorder, unspecified: Secondary | ICD-10-CM | POA: Diagnosis not present

## 2015-11-01 DIAGNOSIS — E7800 Pure hypercholesterolemia, unspecified: Secondary | ICD-10-CM | POA: Diagnosis not present

## 2015-11-01 DIAGNOSIS — R7301 Impaired fasting glucose: Secondary | ICD-10-CM | POA: Diagnosis not present

## 2015-12-09 DIAGNOSIS — H353130 Nonexudative age-related macular degeneration, bilateral, stage unspecified: Secondary | ICD-10-CM | POA: Diagnosis not present

## 2015-12-09 DIAGNOSIS — H2513 Age-related nuclear cataract, bilateral: Secondary | ICD-10-CM | POA: Diagnosis not present

## 2016-02-10 DIAGNOSIS — I480 Paroxysmal atrial fibrillation: Secondary | ICD-10-CM | POA: Diagnosis not present

## 2016-02-10 DIAGNOSIS — I10 Essential (primary) hypertension: Secondary | ICD-10-CM | POA: Diagnosis not present

## 2016-02-24 ENCOUNTER — Other Ambulatory Visit: Payer: Self-pay | Admitting: Family Medicine

## 2016-02-24 DIAGNOSIS — Z1231 Encounter for screening mammogram for malignant neoplasm of breast: Secondary | ICD-10-CM

## 2016-03-13 ENCOUNTER — Ambulatory Visit (INDEPENDENT_AMBULATORY_CARE_PROVIDER_SITE_OTHER): Payer: Commercial Managed Care - HMO

## 2016-03-13 DIAGNOSIS — Z1231 Encounter for screening mammogram for malignant neoplasm of breast: Secondary | ICD-10-CM | POA: Diagnosis not present

## 2016-04-06 DIAGNOSIS — D329 Benign neoplasm of meninges, unspecified: Secondary | ICD-10-CM | POA: Diagnosis not present

## 2016-04-06 DIAGNOSIS — R220 Localized swelling, mass and lump, head: Secondary | ICD-10-CM | POA: Diagnosis not present

## 2016-04-06 DIAGNOSIS — G939 Disorder of brain, unspecified: Secondary | ICD-10-CM | POA: Diagnosis not present

## 2016-04-21 DIAGNOSIS — M79672 Pain in left foot: Secondary | ICD-10-CM | POA: Diagnosis not present

## 2016-05-05 DIAGNOSIS — M8588 Other specified disorders of bone density and structure, other site: Secondary | ICD-10-CM | POA: Diagnosis not present

## 2016-05-05 DIAGNOSIS — F419 Anxiety disorder, unspecified: Secondary | ICD-10-CM | POA: Diagnosis not present

## 2016-05-05 DIAGNOSIS — E559 Vitamin D deficiency, unspecified: Secondary | ICD-10-CM | POA: Diagnosis not present

## 2016-05-05 DIAGNOSIS — K219 Gastro-esophageal reflux disease without esophagitis: Secondary | ICD-10-CM | POA: Diagnosis not present

## 2016-05-05 DIAGNOSIS — E7800 Pure hypercholesterolemia, unspecified: Secondary | ICD-10-CM | POA: Diagnosis not present

## 2016-05-05 DIAGNOSIS — Z0000 Encounter for general adult medical examination without abnormal findings: Secondary | ICD-10-CM | POA: Diagnosis not present

## 2016-05-05 DIAGNOSIS — D329 Benign neoplasm of meninges, unspecified: Secondary | ICD-10-CM | POA: Diagnosis not present

## 2016-05-05 DIAGNOSIS — R7301 Impaired fasting glucose: Secondary | ICD-10-CM | POA: Diagnosis not present

## 2016-05-05 DIAGNOSIS — I10 Essential (primary) hypertension: Secondary | ICD-10-CM | POA: Diagnosis not present

## 2016-05-14 DIAGNOSIS — M8580 Other specified disorders of bone density and structure, unspecified site: Secondary | ICD-10-CM | POA: Diagnosis not present

## 2016-05-14 DIAGNOSIS — M899 Disorder of bone, unspecified: Secondary | ICD-10-CM | POA: Diagnosis not present

## 2016-06-10 DIAGNOSIS — J3489 Other specified disorders of nose and nasal sinuses: Secondary | ICD-10-CM | POA: Diagnosis not present

## 2016-06-10 DIAGNOSIS — G43909 Migraine, unspecified, not intractable, without status migrainosus: Secondary | ICD-10-CM | POA: Diagnosis not present

## 2016-06-10 DIAGNOSIS — R51 Headache: Secondary | ICD-10-CM | POA: Diagnosis not present

## 2016-06-10 DIAGNOSIS — J309 Allergic rhinitis, unspecified: Secondary | ICD-10-CM | POA: Diagnosis not present

## 2016-07-30 DIAGNOSIS — Z23 Encounter for immunization: Secondary | ICD-10-CM | POA: Diagnosis not present

## 2016-08-10 IMAGING — MG MM DIGITAL SCREENING
5 series · 5 of 5 positions shown · non-contrast
Comparison: Previous exam(s).

CLINICAL DATA: Screening.

EXAM:
DIGITAL SCREENING BILATERAL MAMMOGRAM WITH CAD

[R CC (1 of 2)]
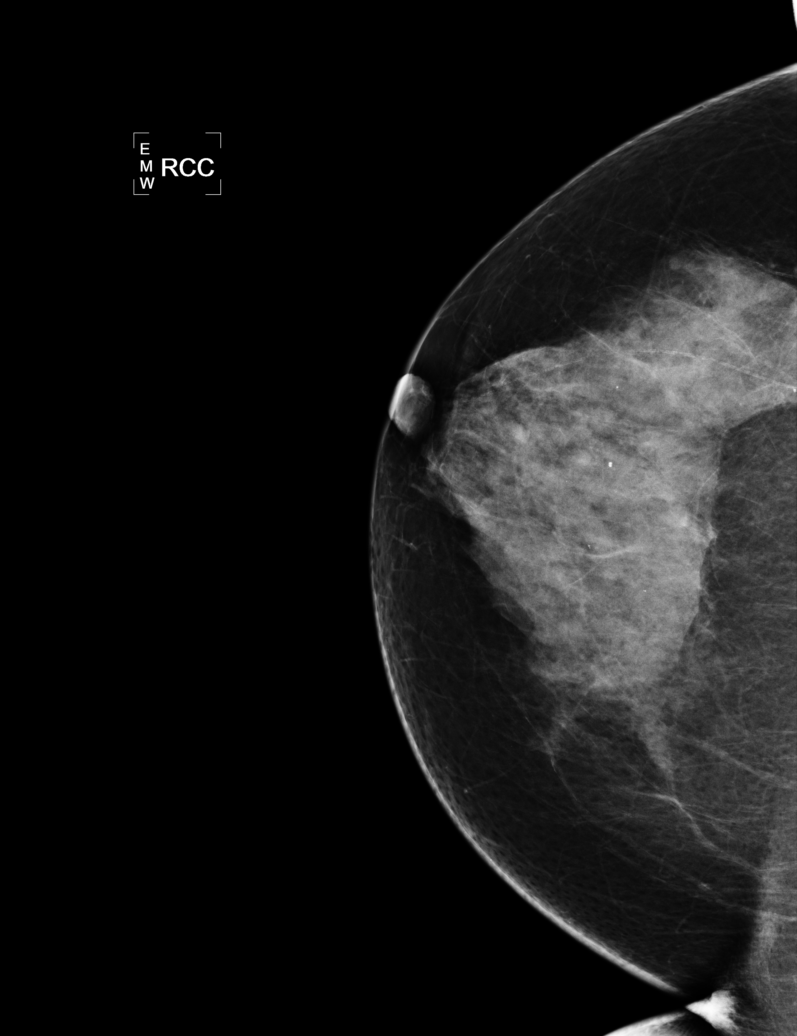

[L CC]
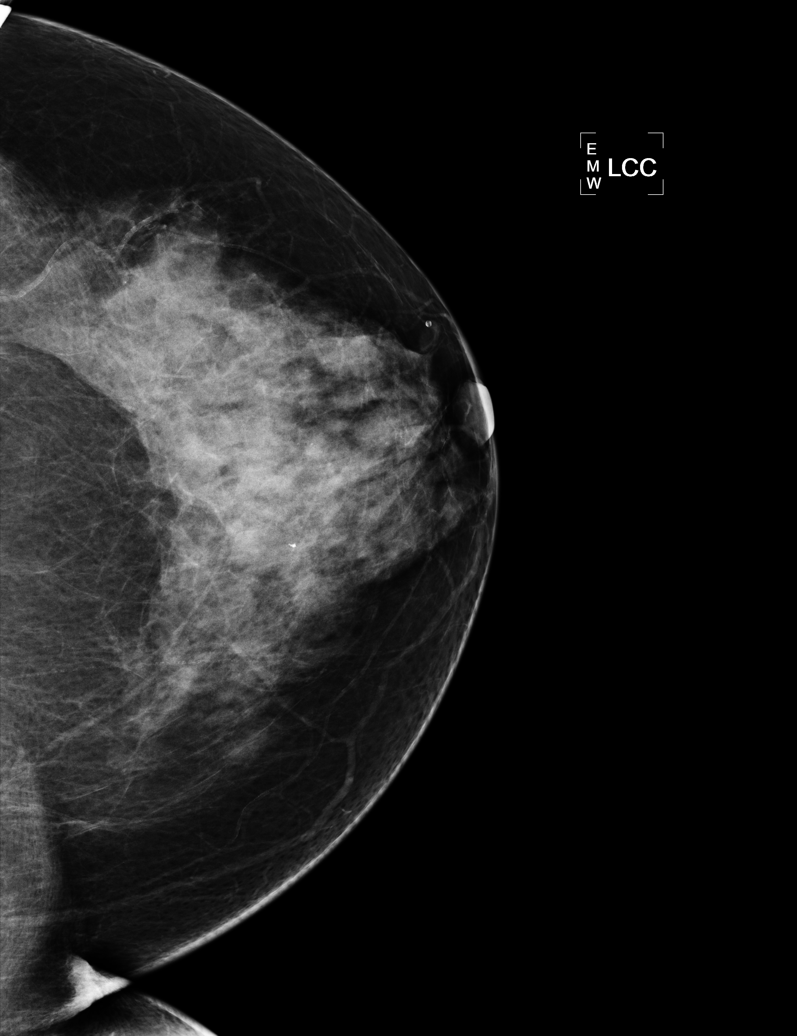

[L MLO]
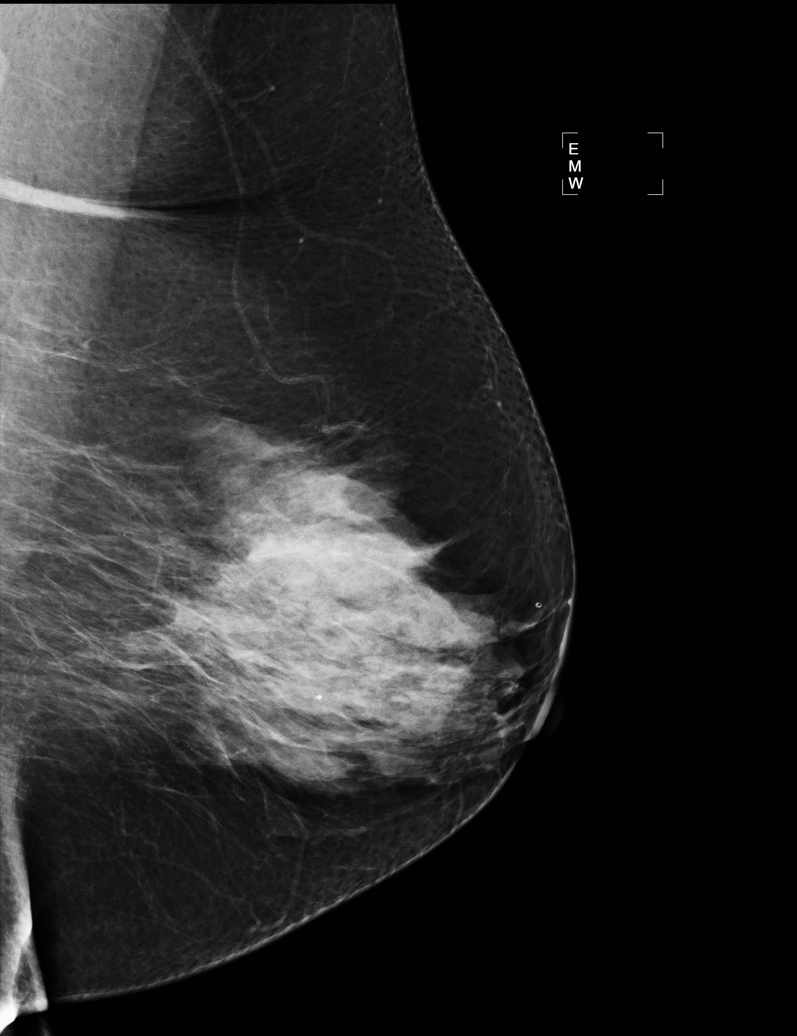

[R MLO]
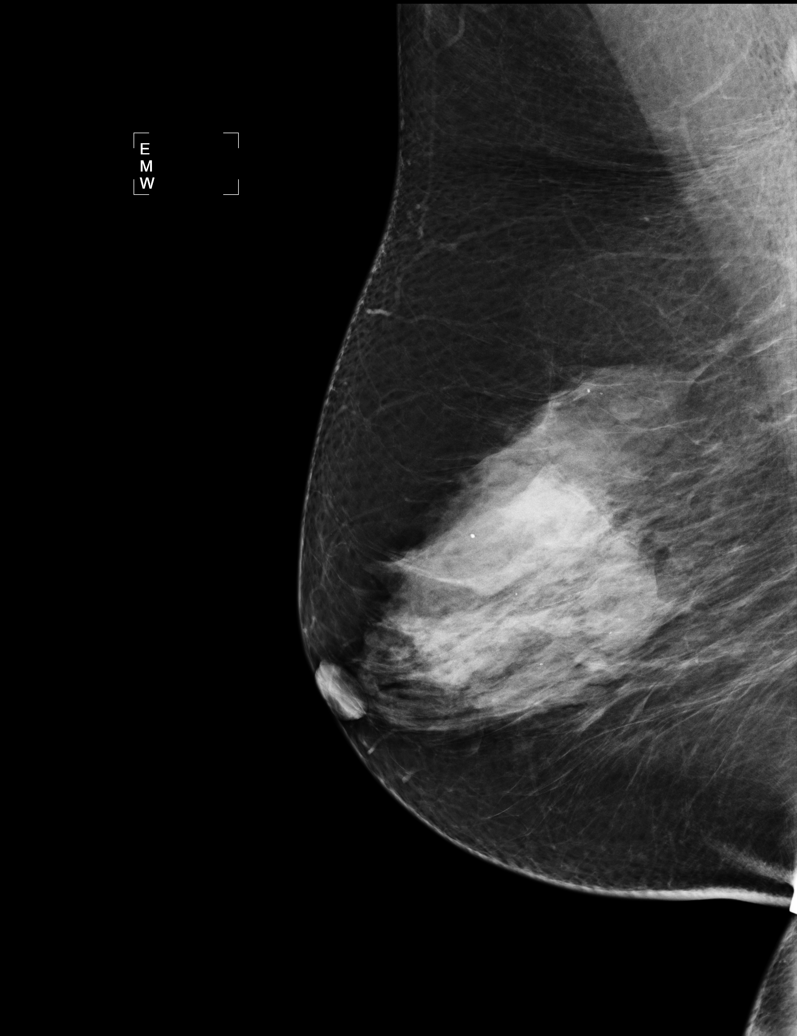

[R CC (2 of 2)]
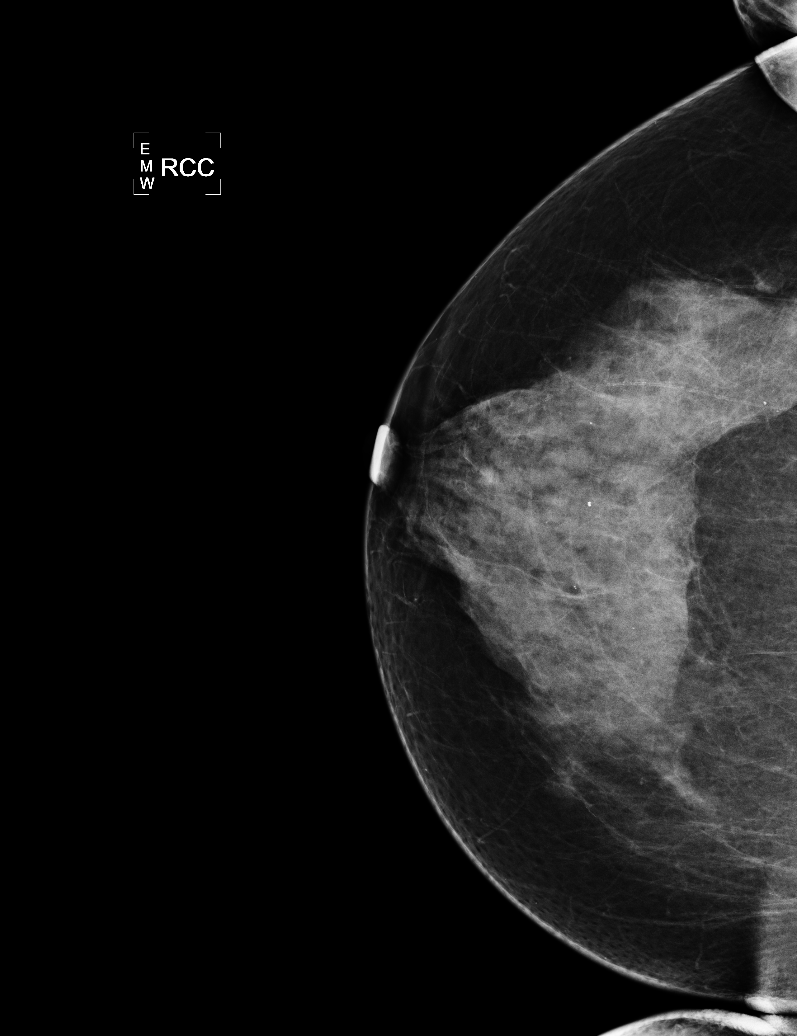

[5 of 5 positions shown; findings below may reference images not displayed]

ACR Breast Density Category c: The breast tissue is heterogeneously
dense, which may obscure small masses.
FINDINGS: There are no findings suspicious for malignancy. Images were
processed with CAD.
IMPRESSION: No mammographic evidence of malignancy. A result letter of this
screening mammogram will be mailed directly to the patient.

RECOMMENDATION:
Screening mammogram in one year. (Code:YJ-2-FEZ)

BI-RADS CATEGORY  1: Negative.

## 2016-09-21 DIAGNOSIS — J329 Chronic sinusitis, unspecified: Secondary | ICD-10-CM | POA: Diagnosis not present

## 2016-11-03 DIAGNOSIS — E7800 Pure hypercholesterolemia, unspecified: Secondary | ICD-10-CM | POA: Diagnosis not present

## 2016-11-03 DIAGNOSIS — I4891 Unspecified atrial fibrillation: Secondary | ICD-10-CM | POA: Diagnosis not present

## 2016-11-03 DIAGNOSIS — D329 Benign neoplasm of meninges, unspecified: Secondary | ICD-10-CM | POA: Diagnosis not present

## 2016-11-03 DIAGNOSIS — I10 Essential (primary) hypertension: Secondary | ICD-10-CM | POA: Diagnosis not present

## 2016-11-03 DIAGNOSIS — E559 Vitamin D deficiency, unspecified: Secondary | ICD-10-CM | POA: Diagnosis not present

## 2016-11-03 DIAGNOSIS — L659 Nonscarring hair loss, unspecified: Secondary | ICD-10-CM | POA: Diagnosis not present

## 2016-11-03 DIAGNOSIS — F419 Anxiety disorder, unspecified: Secondary | ICD-10-CM | POA: Diagnosis not present

## 2016-11-03 DIAGNOSIS — J309 Allergic rhinitis, unspecified: Secondary | ICD-10-CM | POA: Diagnosis not present

## 2016-11-03 DIAGNOSIS — R7301 Impaired fasting glucose: Secondary | ICD-10-CM | POA: Diagnosis not present

## 2017-01-08 DIAGNOSIS — F419 Anxiety disorder, unspecified: Secondary | ICD-10-CM | POA: Diagnosis not present

## 2017-01-08 DIAGNOSIS — M279 Disease of jaws, unspecified: Secondary | ICD-10-CM | POA: Diagnosis not present

## 2017-02-24 ENCOUNTER — Other Ambulatory Visit: Payer: Self-pay | Admitting: Family Medicine

## 2017-02-24 DIAGNOSIS — Z1231 Encounter for screening mammogram for malignant neoplasm of breast: Secondary | ICD-10-CM

## 2017-03-16 ENCOUNTER — Ambulatory Visit: Payer: Medicare HMO

## 2017-03-23 ENCOUNTER — Ambulatory Visit (INDEPENDENT_AMBULATORY_CARE_PROVIDER_SITE_OTHER): Payer: Medicare HMO

## 2017-03-23 DIAGNOSIS — Z1231 Encounter for screening mammogram for malignant neoplasm of breast: Secondary | ICD-10-CM

## 2017-03-25 DIAGNOSIS — I10 Essential (primary) hypertension: Secondary | ICD-10-CM | POA: Diagnosis not present

## 2017-03-25 DIAGNOSIS — I480 Paroxysmal atrial fibrillation: Secondary | ICD-10-CM | POA: Diagnosis not present

## 2017-05-06 DIAGNOSIS — J181 Lobar pneumonia, unspecified organism: Secondary | ICD-10-CM | POA: Diagnosis not present

## 2017-05-06 DIAGNOSIS — I10 Essential (primary) hypertension: Secondary | ICD-10-CM | POA: Diagnosis not present

## 2017-05-06 DIAGNOSIS — J929 Pleural plaque without asbestos: Secondary | ICD-10-CM | POA: Diagnosis not present

## 2017-05-06 DIAGNOSIS — I517 Cardiomegaly: Secondary | ICD-10-CM | POA: Diagnosis not present

## 2017-05-06 DIAGNOSIS — R0602 Shortness of breath: Secondary | ICD-10-CM | POA: Diagnosis not present

## 2017-05-06 DIAGNOSIS — R05 Cough: Secondary | ICD-10-CM | POA: Diagnosis not present

## 2017-05-09 DIAGNOSIS — J181 Lobar pneumonia, unspecified organism: Secondary | ICD-10-CM | POA: Diagnosis not present

## 2017-05-09 DIAGNOSIS — I10 Essential (primary) hypertension: Secondary | ICD-10-CM | POA: Diagnosis not present

## 2017-05-12 DIAGNOSIS — Z7901 Long term (current) use of anticoagulants: Secondary | ICD-10-CM | POA: Diagnosis not present

## 2017-05-12 DIAGNOSIS — J189 Pneumonia, unspecified organism: Secondary | ICD-10-CM | POA: Diagnosis not present

## 2017-05-12 DIAGNOSIS — J181 Lobar pneumonia, unspecified organism: Secondary | ICD-10-CM | POA: Diagnosis not present

## 2017-05-12 DIAGNOSIS — J9811 Atelectasis: Secondary | ICD-10-CM | POA: Diagnosis not present

## 2017-05-12 DIAGNOSIS — I10 Essential (primary) hypertension: Secondary | ICD-10-CM | POA: Diagnosis not present

## 2017-05-12 DIAGNOSIS — R002 Palpitations: Secondary | ICD-10-CM | POA: Diagnosis not present

## 2017-05-12 DIAGNOSIS — J984 Other disorders of lung: Secondary | ICD-10-CM | POA: Diagnosis not present

## 2017-05-12 DIAGNOSIS — I480 Paroxysmal atrial fibrillation: Secondary | ICD-10-CM | POA: Diagnosis not present

## 2017-05-12 DIAGNOSIS — J40 Bronchitis, not specified as acute or chronic: Secondary | ICD-10-CM | POA: Diagnosis not present

## 2017-05-12 DIAGNOSIS — R079 Chest pain, unspecified: Secondary | ICD-10-CM | POA: Diagnosis not present

## 2017-05-12 DIAGNOSIS — E785 Hyperlipidemia, unspecified: Secondary | ICD-10-CM | POA: Diagnosis not present

## 2017-05-12 DIAGNOSIS — I4891 Unspecified atrial fibrillation: Secondary | ICD-10-CM | POA: Diagnosis not present

## 2017-05-12 DIAGNOSIS — K219 Gastro-esophageal reflux disease without esophagitis: Secondary | ICD-10-CM | POA: Diagnosis not present

## 2017-05-12 DIAGNOSIS — I517 Cardiomegaly: Secondary | ICD-10-CM | POA: Diagnosis not present

## 2017-05-12 DIAGNOSIS — R05 Cough: Secondary | ICD-10-CM | POA: Diagnosis not present

## 2017-05-12 DIAGNOSIS — R0602 Shortness of breath: Secondary | ICD-10-CM | POA: Diagnosis not present

## 2017-05-12 DIAGNOSIS — R197 Diarrhea, unspecified: Secondary | ICD-10-CM | POA: Diagnosis not present

## 2017-05-13 DIAGNOSIS — J40 Bronchitis, not specified as acute or chronic: Secondary | ICD-10-CM | POA: Diagnosis not present

## 2017-05-13 DIAGNOSIS — I4891 Unspecified atrial fibrillation: Secondary | ICD-10-CM | POA: Diagnosis not present

## 2017-05-13 DIAGNOSIS — I059 Rheumatic mitral valve disease, unspecified: Secondary | ICD-10-CM | POA: Diagnosis not present

## 2017-05-13 DIAGNOSIS — R197 Diarrhea, unspecified: Secondary | ICD-10-CM | POA: Diagnosis not present

## 2017-05-13 DIAGNOSIS — K219 Gastro-esophageal reflux disease without esophagitis: Secondary | ICD-10-CM | POA: Diagnosis not present

## 2017-05-13 DIAGNOSIS — J181 Lobar pneumonia, unspecified organism: Secondary | ICD-10-CM | POA: Diagnosis not present

## 2017-05-13 DIAGNOSIS — Z7901 Long term (current) use of anticoagulants: Secondary | ICD-10-CM | POA: Diagnosis not present

## 2017-05-13 DIAGNOSIS — I517 Cardiomegaly: Secondary | ICD-10-CM | POA: Diagnosis not present

## 2017-05-13 DIAGNOSIS — I10 Essential (primary) hypertension: Secondary | ICD-10-CM | POA: Diagnosis not present

## 2017-05-13 DIAGNOSIS — E785 Hyperlipidemia, unspecified: Secondary | ICD-10-CM | POA: Diagnosis not present

## 2017-05-14 DIAGNOSIS — J40 Bronchitis, not specified as acute or chronic: Secondary | ICD-10-CM | POA: Diagnosis not present

## 2017-05-14 DIAGNOSIS — R197 Diarrhea, unspecified: Secondary | ICD-10-CM | POA: Diagnosis not present

## 2017-05-14 DIAGNOSIS — I10 Essential (primary) hypertension: Secondary | ICD-10-CM | POA: Diagnosis not present

## 2017-05-14 DIAGNOSIS — E785 Hyperlipidemia, unspecified: Secondary | ICD-10-CM | POA: Diagnosis not present

## 2017-05-14 DIAGNOSIS — J181 Lobar pneumonia, unspecified organism: Secondary | ICD-10-CM | POA: Diagnosis not present

## 2017-05-14 DIAGNOSIS — K219 Gastro-esophageal reflux disease without esophagitis: Secondary | ICD-10-CM | POA: Diagnosis not present

## 2017-05-14 DIAGNOSIS — Z7901 Long term (current) use of anticoagulants: Secondary | ICD-10-CM | POA: Diagnosis not present

## 2017-05-14 DIAGNOSIS — I4891 Unspecified atrial fibrillation: Secondary | ICD-10-CM | POA: Diagnosis not present

## 2017-05-20 DIAGNOSIS — J189 Pneumonia, unspecified organism: Secondary | ICD-10-CM | POA: Diagnosis not present

## 2017-05-20 DIAGNOSIS — I4891 Unspecified atrial fibrillation: Secondary | ICD-10-CM | POA: Diagnosis not present

## 2017-05-25 DIAGNOSIS — I480 Paroxysmal atrial fibrillation: Secondary | ICD-10-CM | POA: Diagnosis not present

## 2017-05-25 DIAGNOSIS — E785 Hyperlipidemia, unspecified: Secondary | ICD-10-CM | POA: Diagnosis not present

## 2017-05-25 DIAGNOSIS — I10 Essential (primary) hypertension: Secondary | ICD-10-CM | POA: Diagnosis not present

## 2017-05-25 DIAGNOSIS — I517 Cardiomegaly: Secondary | ICD-10-CM | POA: Diagnosis not present

## 2017-05-25 DIAGNOSIS — Z79899 Other long term (current) drug therapy: Secondary | ICD-10-CM | POA: Diagnosis not present

## 2017-05-25 DIAGNOSIS — M7989 Other specified soft tissue disorders: Secondary | ICD-10-CM | POA: Diagnosis not present

## 2017-05-25 DIAGNOSIS — R531 Weakness: Secondary | ICD-10-CM | POA: Diagnosis not present

## 2017-05-25 DIAGNOSIS — I4891 Unspecified atrial fibrillation: Secondary | ICD-10-CM | POA: Diagnosis not present

## 2017-05-25 DIAGNOSIS — R079 Chest pain, unspecified: Secondary | ICD-10-CM | POA: Diagnosis not present

## 2017-05-25 DIAGNOSIS — Z7982 Long term (current) use of aspirin: Secondary | ICD-10-CM | POA: Diagnosis not present

## 2017-05-25 DIAGNOSIS — I481 Persistent atrial fibrillation: Secondary | ICD-10-CM | POA: Diagnosis not present

## 2017-05-25 DIAGNOSIS — M8580 Other specified disorders of bone density and structure, unspecified site: Secondary | ICD-10-CM | POA: Diagnosis not present

## 2017-05-25 DIAGNOSIS — R6883 Chills (without fever): Secondary | ICD-10-CM | POA: Diagnosis not present

## 2017-05-25 DIAGNOSIS — K219 Gastro-esophageal reflux disease without esophagitis: Secondary | ICD-10-CM | POA: Diagnosis not present

## 2017-05-25 DIAGNOSIS — Z7901 Long term (current) use of anticoagulants: Secondary | ICD-10-CM | POA: Diagnosis not present

## 2017-05-25 DIAGNOSIS — I499 Cardiac arrhythmia, unspecified: Secondary | ICD-10-CM | POA: Diagnosis not present

## 2017-05-25 DIAGNOSIS — E559 Vitamin D deficiency, unspecified: Secondary | ICD-10-CM | POA: Diagnosis not present

## 2017-05-26 DIAGNOSIS — Z7901 Long term (current) use of anticoagulants: Secondary | ICD-10-CM | POA: Diagnosis not present

## 2017-05-26 DIAGNOSIS — E785 Hyperlipidemia, unspecified: Secondary | ICD-10-CM | POA: Diagnosis not present

## 2017-05-26 DIAGNOSIS — M7989 Other specified soft tissue disorders: Secondary | ICD-10-CM | POA: Diagnosis not present

## 2017-05-26 DIAGNOSIS — M8580 Other specified disorders of bone density and structure, unspecified site: Secondary | ICD-10-CM | POA: Diagnosis not present

## 2017-05-26 DIAGNOSIS — R079 Chest pain, unspecified: Secondary | ICD-10-CM | POA: Diagnosis not present

## 2017-05-26 DIAGNOSIS — K219 Gastro-esophageal reflux disease without esophagitis: Secondary | ICD-10-CM | POA: Diagnosis not present

## 2017-05-26 DIAGNOSIS — I10 Essential (primary) hypertension: Secondary | ICD-10-CM | POA: Diagnosis not present

## 2017-05-26 DIAGNOSIS — E559 Vitamin D deficiency, unspecified: Secondary | ICD-10-CM | POA: Diagnosis not present

## 2017-05-26 DIAGNOSIS — I4891 Unspecified atrial fibrillation: Secondary | ICD-10-CM | POA: Diagnosis not present

## 2017-05-27 DIAGNOSIS — K219 Gastro-esophageal reflux disease without esophagitis: Secondary | ICD-10-CM | POA: Diagnosis not present

## 2017-05-27 DIAGNOSIS — E559 Vitamin D deficiency, unspecified: Secondary | ICD-10-CM | POA: Diagnosis not present

## 2017-05-27 DIAGNOSIS — I4891 Unspecified atrial fibrillation: Secondary | ICD-10-CM | POA: Diagnosis not present

## 2017-05-27 DIAGNOSIS — Z7901 Long term (current) use of anticoagulants: Secondary | ICD-10-CM | POA: Diagnosis not present

## 2017-05-27 DIAGNOSIS — I340 Nonrheumatic mitral (valve) insufficiency: Secondary | ICD-10-CM | POA: Diagnosis not present

## 2017-05-27 DIAGNOSIS — E785 Hyperlipidemia, unspecified: Secondary | ICD-10-CM | POA: Diagnosis not present

## 2017-05-27 DIAGNOSIS — I700 Atherosclerosis of aorta: Secondary | ICD-10-CM | POA: Diagnosis not present

## 2017-05-27 DIAGNOSIS — I517 Cardiomegaly: Secondary | ICD-10-CM | POA: Diagnosis not present

## 2017-05-27 DIAGNOSIS — I10 Essential (primary) hypertension: Secondary | ICD-10-CM | POA: Diagnosis not present

## 2017-05-27 DIAGNOSIS — I083 Combined rheumatic disorders of mitral, aortic and tricuspid valves: Secondary | ICD-10-CM | POA: Diagnosis not present

## 2017-05-27 DIAGNOSIS — M8580 Other specified disorders of bone density and structure, unspecified site: Secondary | ICD-10-CM | POA: Diagnosis not present

## 2017-05-28 DIAGNOSIS — I4891 Unspecified atrial fibrillation: Secondary | ICD-10-CM | POA: Diagnosis not present

## 2017-05-28 DIAGNOSIS — I340 Nonrheumatic mitral (valve) insufficiency: Secondary | ICD-10-CM | POA: Diagnosis not present

## 2017-05-28 DIAGNOSIS — M8580 Other specified disorders of bone density and structure, unspecified site: Secondary | ICD-10-CM | POA: Diagnosis not present

## 2017-05-28 DIAGNOSIS — Z7901 Long term (current) use of anticoagulants: Secondary | ICD-10-CM | POA: Diagnosis not present

## 2017-05-28 DIAGNOSIS — I10 Essential (primary) hypertension: Secondary | ICD-10-CM | POA: Diagnosis not present

## 2017-05-28 DIAGNOSIS — E785 Hyperlipidemia, unspecified: Secondary | ICD-10-CM | POA: Diagnosis not present

## 2017-05-28 DIAGNOSIS — K219 Gastro-esophageal reflux disease without esophagitis: Secondary | ICD-10-CM | POA: Diagnosis not present

## 2017-05-28 DIAGNOSIS — E559 Vitamin D deficiency, unspecified: Secondary | ICD-10-CM | POA: Diagnosis not present

## 2017-05-29 DIAGNOSIS — I340 Nonrheumatic mitral (valve) insufficiency: Secondary | ICD-10-CM | POA: Diagnosis not present

## 2017-05-29 DIAGNOSIS — I481 Persistent atrial fibrillation: Secondary | ICD-10-CM | POA: Diagnosis not present

## 2017-05-29 DIAGNOSIS — Z7901 Long term (current) use of anticoagulants: Secondary | ICD-10-CM | POA: Diagnosis not present

## 2017-05-29 DIAGNOSIS — R531 Weakness: Secondary | ICD-10-CM | POA: Diagnosis not present

## 2017-05-29 DIAGNOSIS — M8580 Other specified disorders of bone density and structure, unspecified site: Secondary | ICD-10-CM | POA: Diagnosis not present

## 2017-05-29 DIAGNOSIS — R001 Bradycardia, unspecified: Secondary | ICD-10-CM | POA: Diagnosis not present

## 2017-05-29 DIAGNOSIS — I480 Paroxysmal atrial fibrillation: Secondary | ICD-10-CM | POA: Diagnosis not present

## 2017-05-29 DIAGNOSIS — R079 Chest pain, unspecified: Secondary | ICD-10-CM | POA: Diagnosis not present

## 2017-05-29 DIAGNOSIS — R110 Nausea: Secondary | ICD-10-CM | POA: Diagnosis not present

## 2017-05-29 DIAGNOSIS — R600 Localized edema: Secondary | ICD-10-CM | POA: Diagnosis not present

## 2017-05-29 DIAGNOSIS — E559 Vitamin D deficiency, unspecified: Secondary | ICD-10-CM | POA: Diagnosis not present

## 2017-05-29 DIAGNOSIS — E785 Hyperlipidemia, unspecified: Secondary | ICD-10-CM | POA: Diagnosis not present

## 2017-05-29 DIAGNOSIS — I10 Essential (primary) hypertension: Secondary | ICD-10-CM | POA: Diagnosis not present

## 2017-05-29 DIAGNOSIS — I4891 Unspecified atrial fibrillation: Secondary | ICD-10-CM | POA: Diagnosis not present

## 2017-05-29 DIAGNOSIS — K219 Gastro-esophageal reflux disease without esophagitis: Secondary | ICD-10-CM | POA: Diagnosis not present

## 2017-06-08 DIAGNOSIS — Z9889 Other specified postprocedural states: Secondary | ICD-10-CM | POA: Diagnosis not present

## 2017-06-08 DIAGNOSIS — I10 Essential (primary) hypertension: Secondary | ICD-10-CM | POA: Diagnosis not present

## 2017-06-08 DIAGNOSIS — Z8679 Personal history of other diseases of the circulatory system: Secondary | ICD-10-CM | POA: Diagnosis not present

## 2017-06-17 ENCOUNTER — Encounter (HOSPITAL_COMMUNITY): Payer: Self-pay

## 2017-06-17 ENCOUNTER — Emergency Department (HOSPITAL_COMMUNITY)
Admission: EM | Admit: 2017-06-17 | Discharge: 2017-06-17 | Disposition: A | Discharge status: Home / Self Care | Payer: Medicare HMO | Attending: Emergency Medicine | Admitting: Emergency Medicine

## 2017-06-17 ENCOUNTER — Emergency Department (HOSPITAL_COMMUNITY): Payer: Medicare HMO

## 2017-06-17 DIAGNOSIS — J45909 Unspecified asthma, uncomplicated: Secondary | ICD-10-CM | POA: Diagnosis not present

## 2017-06-17 DIAGNOSIS — Z79899 Other long term (current) drug therapy: Secondary | ICD-10-CM | POA: Diagnosis not present

## 2017-06-17 DIAGNOSIS — Z85038 Personal history of other malignant neoplasm of large intestine: Secondary | ICD-10-CM | POA: Diagnosis not present

## 2017-06-17 DIAGNOSIS — Z7901 Long term (current) use of anticoagulants: Secondary | ICD-10-CM | POA: Diagnosis not present

## 2017-06-17 DIAGNOSIS — R5383 Other fatigue: Secondary | ICD-10-CM | POA: Diagnosis present

## 2017-06-17 DIAGNOSIS — R531 Weakness: Secondary | ICD-10-CM | POA: Diagnosis not present

## 2017-06-17 DIAGNOSIS — R0689 Other abnormalities of breathing: Secondary | ICD-10-CM | POA: Insufficient documentation

## 2017-06-17 DIAGNOSIS — R069 Unspecified abnormalities of breathing: Secondary | ICD-10-CM | POA: Diagnosis not present

## 2017-06-17 DIAGNOSIS — R079 Chest pain, unspecified: Secondary | ICD-10-CM | POA: Diagnosis not present

## 2017-06-17 DIAGNOSIS — I480 Paroxysmal atrial fibrillation: Secondary | ICD-10-CM | POA: Diagnosis not present

## 2017-06-17 DIAGNOSIS — I10 Essential (primary) hypertension: Secondary | ICD-10-CM | POA: Diagnosis not present

## 2017-06-17 DIAGNOSIS — R002 Palpitations: Secondary | ICD-10-CM | POA: Diagnosis not present

## 2017-06-17 DIAGNOSIS — I4891 Unspecified atrial fibrillation: Secondary | ICD-10-CM | POA: Diagnosis not present

## 2017-06-17 DIAGNOSIS — R9431 Abnormal electrocardiogram [ECG] [EKG]: Secondary | ICD-10-CM | POA: Diagnosis not present

## 2017-06-17 LAB — COMPREHENSIVE METABOLIC PANEL
ALT: 16 U/L (ref 14–54)
ANION GAP: 11 (ref 5–15)
AST: 21 U/L (ref 15–41)
Albumin: 3.9 g/dL (ref 3.5–5.0)
Alkaline Phosphatase: 90 U/L (ref 38–126)
BUN: 8 mg/dL (ref 6–20)
CALCIUM: 9.8 mg/dL (ref 8.9–10.3)
CO2: 22 mmol/L (ref 22–32)
Chloride: 106 mmol/L (ref 101–111)
Creatinine, Ser: 0.89 mg/dL (ref 0.44–1.00)
Glucose, Bld: 110 mg/dL — ABNORMAL HIGH (ref 65–99)
Potassium: 3.2 mmol/L — ABNORMAL LOW (ref 3.5–5.1)
SODIUM: 139 mmol/L (ref 135–145)
TOTAL PROTEIN: 7.2 g/dL (ref 6.5–8.1)
Total Bilirubin: 1.1 mg/dL (ref 0.3–1.2)

## 2017-06-17 LAB — CBC WITH DIFFERENTIAL/PLATELET
BASOS ABS: 0 10*3/uL (ref 0.0–0.1)
Basophils Relative: 1 %
EOS ABS: 0.1 10*3/uL (ref 0.0–0.7)
EOS PCT: 2 %
HCT: 42.3 % (ref 36.0–46.0)
Hemoglobin: 14.4 g/dL (ref 12.0–15.0)
LYMPHS PCT: 28 %
Lymphs Abs: 1.7 10*3/uL (ref 0.7–4.0)
MCH: 31 pg (ref 26.0–34.0)
MCHC: 34 g/dL (ref 30.0–36.0)
MCV: 91.2 fL (ref 78.0–100.0)
MONO ABS: 0.7 10*3/uL (ref 0.1–1.0)
Monocytes Relative: 12 %
Neutro Abs: 3.4 10*3/uL (ref 1.7–7.7)
Neutrophils Relative %: 57 %
PLATELETS: 283 10*3/uL (ref 150–400)
RBC: 4.64 MIL/uL (ref 3.87–5.11)
RDW: 12.8 % (ref 11.5–15.5)
WBC: 5.9 10*3/uL (ref 4.0–10.5)

## 2017-06-17 LAB — PROTIME-INR
INR: 1.34
Prothrombin Time: 16.5 seconds — ABNORMAL HIGH (ref 11.4–15.2)

## 2017-06-17 LAB — TROPONIN I: Troponin I: <0.03 ng/mL (ref <0.03)

## 2017-06-17 LAB — BRAIN NATRIURETIC PEPTIDE: B Natriuretic Peptide: 498.4 pg/mL — ABNORMAL HIGH (ref 0.0–100.0)

## 2017-06-17 LAB — MAGNESIUM: Magnesium: 2.1 mg/dL (ref 1.7–2.4)

## 2017-06-17 NOTE — Discharge Instructions (Signed)
As discussed, your evaluation today has been largely reassuring.  But, it is important that you monitor your condition carefully, and do not hesitate to return to the ED if you develop new, or concerning changes in your condition. ? ?Otherwise, please follow-up with your physician for appropriate ongoing care. ? ?

## 2017-06-17 NOTE — ED Triage Notes (Signed)
Pt arrives EMS from [facility] with sudden onset of palpitations last night at 2130 that lasted until 9am. Once palpitations subsided pt remained weak. Pt has been hospitalized for afib multiple times in the last month. AFIB on monitor rate 70-110. Pt takes eliquis.   A&Ox4 168/88 hr88 afib rr 18 spo2 97% RA  #20 LH. No medication PTA. Denies pain

## 2017-06-17 NOTE — ED Notes (Signed)
Patient transported to X-ray 

## 2017-06-17 NOTE — ED Provider Notes (Signed)
[facility] DEPT Provider Note   CSN: [id number] Arrival date & time: 06/17/17  1255     History   Chief Complaint Chief Complaint  Patient presents with  . Atrial Fibrillation    HPI Kathryn Adams is a 76 y.o. female.  HPI  Patient presents with fatigue. Patient has multiple medical issues including atrial fibrillation para Last night approximately 12 hours ago the patient had an episode of sustained palpitations, chest discomfort, sensation similar to prior episodes of atrial fibrillation with rapid ventricular response. Symptoms resolved, and the patient states that she currently only feels tired. She states that her heart is not acting up currently. She notes that she recently switched her medication used for atrial fibrillation control from prior usage of Cardizem and flecainide to current usage of multiaq.  Past Medical History:  Diagnosis Date  . Anxiety    "at times" (01/16/2014)  . Arthritis    "little in my knees maybe" (01/16/2014)  . Bronchial asthma   . Chronic bronchitis ([place])    "last 3 yrs" (01/16/2014)  . Chronic lower back pain   . Colon cancer ([place])    "polyp he removed was cover in cancer but not attached to the wall" (01/16/2014)  . Daily headache   . GERD (gastroesophageal reflux disease)   . Hyperlipidemia   . Hypertension   . Rheumatic fever without heart involvement as child    Patient Active Problem List   Diagnosis Date Noted  . Atrial fibrillation ([place]) 01/16/2014    Past Surgical History:  Procedure Laterality Date  . APPENDECTOMY    . COLONOSCOPY  2004; 2005; 2008; 2011; 2014  . DILATION AND CURETTAGE OF UTERUS    . LEFT HEART CATHETERIZATION WITH CORONARY ANGIOGRAM N/A 01/17/2014   Procedure: LEFT HEART CATHETERIZATION WITH CORONARY ANGIOGRAM;  Surgeon: Birdie Riddle, MD;  Location: [facility] CATH LAB;  Service: Cardiovascular;  Laterality: N/A;  . POLYPECTOMY  2004   "found cancer"  . TONSILLECTOMY      OB History    No data  available       Home Medications    Prior to Admission medications   Medication Sig Start Date End Date Taking? Authorizing Provider  acetaminophen (TYLENOL) 500 MG tablet Take 1,000 mg by mouth every 6 (six) hours as needed for mild pain or headache.    [provider]  albuterol (PROVENTIL HFA;VENTOLIN HFA) 108 (90 BASE) MCG/ACT inhaler Inhale 2 puffs into the lungs every 6 (six) hours as needed for wheezing or shortness of breath.    [provider]  ALPRAZolam Duanne Moron) 0.5 MG tablet Take 0.25 mg by mouth at bedtime.     [provider]  apixaban (ELIQUIS) 5 MG TABS tablet Take 1 tablet (5 mg total) by mouth 2 (two) times daily. 01/19/14   Dixie Dials, MD  cholecalciferol (VITAMIN D) 1000 UNITS tablet Take 1,000-2,000 Units by mouth daily. Alternates between taking 1 capsule daily, and 2 capsules every other day.    [provider]  diltiazem (CARDIZEM) 60 MG tablet Take 1 tablet (60 mg total) by mouth 2 (two) times daily. 01/19/14   Dixie Dials, MD  hydrochlorothiazide (HYDRODIURIL) 25 MG tablet Take 0.5 tablets (12.5 mg total) by mouth daily. 01/19/14   Dixie Dials, MD  metoprolol (LOPRESSOR) 50 MG tablet Take 50 mg by mouth 2 (two) times daily.    [provider]  omeprazole (PRILOSEC OTC) 20 MG tablet Take 20 mg by mouth daily as needed (for acid  reflux).    [provider]  simvastatin (ZOCOR) 80 MG tablet Take 0.5 tablets (40 mg total) by mouth at bedtime. 01/19/14   Dixie Dials, MD  SUMAtriptan (IMITREX) 50 MG tablet Take 1 tablet (50 mg total) by mouth every 2 (two) hours as needed for migraine or headache. May repeat in 2 hours if headache persists or recurs. 01/19/14   Dixie Dials, MD  vitamin E (VITAMIN E) 400 UNIT capsule Take 400 Units by mouth daily.    [provider]    Family History Family History  Problem Relation Age of Onset  . Colon polyps Father   . Colon cancer Daughter     Social History Social  History  Substance Use Topics  . Smoking status: Never Smoker  . Smokeless tobacco: Never Used  . Alcohol use No     Allergies   Ampicillin and Lisinopril   Review of Systems Review of Systems  Constitutional:       Per HPI, otherwise negative  HENT:       Per HPI, otherwise negative  Respiratory:       Per HPI, otherwise negative  Cardiovascular:       Per HPI, otherwise negative  Gastrointestinal: Negative for vomiting.  Endocrine:       Negative aside from HPI  Genitourinary:       Neg aside from HPI   Musculoskeletal:       Per HPI, otherwise negative  Skin: Negative.   Neurological: Positive for weakness. Negative for syncope.     Physical Exam Updated Vital Signs BP (!) 145/99 (BP Location: Right Arm)   Pulse 79   Temp 99 F (37.2 C) (Oral)   Resp 17   Wt 88.9 kg (196 lb)   SpO2 96%   BMI 29.80 kg/m   Physical Exam  Constitutional: She is oriented to person, place, and time. She appears well-developed and well-nourished. No distress.  HENT:  Head: Normocephalic and atraumatic.  Eyes: Conjunctivae and EOM are normal.  Cardiovascular: Normal rate.  An irregularly irregular rhythm present.  Pulmonary/Chest: Effort normal and breath sounds normal. No stridor. No respiratory distress.  Abdominal: She exhibits no distension.  Musculoskeletal: She exhibits no edema.  Neurological: She is alert and oriented to person, place, and time. No cranial nerve deficit.  Skin: Skin is warm and dry.  Psychiatric: She has a normal mood and affect.  Nursing note and vitals reviewed.    ED Treatments / Results  Labs (all labs ordered are listed, but only abnormal results are displayed) Labs Reviewed  COMPREHENSIVE METABOLIC PANEL - Abnormal; Notable for the following:       Result Value   Potassium 3.2 (*)    Glucose, Bld 110 (*)    All other components within normal limits  PROTIME-INR - Abnormal; Notable for the following:    Prothrombin Time 16.5 (*)    All  other components within normal limits  MAGNESIUM  TROPONIN I  CBC WITH DIFFERENTIAL/PLATELET  BRAIN NATRIURETIC PEPTIDE  CBG MONITORING, ED    EKG  EKG Interpretation  Date/Time:  Thursday June 17 2017 13:10:32 EDT Ventricular Rate:  86 PR Interval:    QRS Duration: 82 QT Interval:  398 QTC Calculation: 476 R Axis:     Text Interpretation:  Atrial fibrillation Nonspecific ST and T wave abnormality , probably digitalis effect Prolonged QT Abnormal ekg Confirmed by Carmin Muskrat [phone]) on 06/17/2017 1:29:34 PM       Radiology Dg Chest  Portable 1 View  Result Date: 06/17/2017 CLINICAL DATA:  Chest pain. EXAM: PORTABLE CHEST 1 VIEW COMPARISON:  Radiographs of March 15, 2014. FINDINGS: Stable cardiomegaly. Atherosclerosis of thoracic aorta is noted. No pneumothorax or pleural effusion is noted. No acute pulmonary disease is noted. Bony thorax is unremarkable. IMPRESSION: No acute cardiopulmonary abnormality seen.  Aortic atherosclerosis. Electronically Signed   By: Marijo Conception, M.D.   On: 06/17/2017 14:34    Procedures Procedures (including critical care time)    Initial Impression / Assessment and Plan / ED Course  I have reviewed the triage vital signs and the nursing notes.  Pertinent labs & imaging results that were available during my care of the patient were reviewed by me and considered in my medical decision making (see chart for details).  4:36 PM On repeat exam the patient is awake and alert, smiling, states that she is ready to leave. Weussed today's findings, and her history of atrial fibrillation, and ongoing trials of medication for control. She has no recurrence of tachycardia,no ongoing complaints Patient will follow up with her cardiologist I reviewed the patient's chart, and echocardiogram from 2045. She states that she has had one since that time, thos unsure of the results, or the time. With no ongoing chest pain, no evidence for ACS, persistent  rapid rate, or other acute new phenomena, the patient was discharged in stable condition with close outpatient follow-up with her cardiologist.  Final Clinical Impressions(s) / ED Diagnoses  Atrial fibrillation Weakness   Carmin Muskrat, MD 06/17/17 1637

## 2017-06-17 NOTE — ED Notes (Signed)
Back from xray

## 2017-06-18 ENCOUNTER — Telehealth (HOSPITAL_COMMUNITY): Payer: Self-pay | Admitting: *Deleted

## 2017-06-18 NOTE — Telephone Encounter (Signed)
Pt on afib ED f/u report.  I cld pt to offer appt for follow up. She stated that she would need to get back to me on making an appt as she was not feeling well.  She was given the number

## 2017-06-28 DIAGNOSIS — I480 Paroxysmal atrial fibrillation: Secondary | ICD-10-CM | POA: Diagnosis not present

## 2017-06-28 DIAGNOSIS — I340 Nonrheumatic mitral (valve) insufficiency: Secondary | ICD-10-CM | POA: Diagnosis not present

## 2017-06-28 DIAGNOSIS — I10 Essential (primary) hypertension: Secondary | ICD-10-CM | POA: Diagnosis not present

## 2017-08-04 DIAGNOSIS — Z0000 Encounter for general adult medical examination without abnormal findings: Secondary | ICD-10-CM | POA: Diagnosis not present

## 2017-08-04 DIAGNOSIS — F419 Anxiety disorder, unspecified: Secondary | ICD-10-CM | POA: Diagnosis not present

## 2017-08-04 DIAGNOSIS — I10 Essential (primary) hypertension: Secondary | ICD-10-CM | POA: Diagnosis not present

## 2017-08-04 DIAGNOSIS — R531 Weakness: Secondary | ICD-10-CM | POA: Diagnosis not present

## 2017-08-04 DIAGNOSIS — M8588 Other specified disorders of bone density and structure, other site: Secondary | ICD-10-CM | POA: Diagnosis not present

## 2017-08-04 DIAGNOSIS — Z23 Encounter for immunization: Secondary | ICD-10-CM | POA: Diagnosis not present

## 2017-08-04 DIAGNOSIS — E7800 Pure hypercholesterolemia, unspecified: Secondary | ICD-10-CM | POA: Diagnosis not present

## 2017-08-04 DIAGNOSIS — R7301 Impaired fasting glucose: Secondary | ICD-10-CM | POA: Diagnosis not present

## 2017-08-04 DIAGNOSIS — E559 Vitamin D deficiency, unspecified: Secondary | ICD-10-CM | POA: Diagnosis not present

## 2017-08-04 DIAGNOSIS — Z1389 Encounter for screening for other disorder: Secondary | ICD-10-CM | POA: Diagnosis not present

## 2017-08-18 DIAGNOSIS — Z7901 Long term (current) use of anticoagulants: Secondary | ICD-10-CM | POA: Diagnosis not present

## 2017-08-18 DIAGNOSIS — I480 Paroxysmal atrial fibrillation: Secondary | ICD-10-CM | POA: Diagnosis not present

## 2017-09-28 DIAGNOSIS — Z7901 Long term (current) use of anticoagulants: Secondary | ICD-10-CM | POA: Diagnosis not present

## 2017-09-28 DIAGNOSIS — Z9889 Other specified postprocedural states: Secondary | ICD-10-CM | POA: Diagnosis not present

## 2017-09-28 DIAGNOSIS — I480 Paroxysmal atrial fibrillation: Secondary | ICD-10-CM | POA: Diagnosis not present

## 2017-11-29 DIAGNOSIS — I480 Paroxysmal atrial fibrillation: Secondary | ICD-10-CM | POA: Diagnosis not present

## 2017-12-02 DIAGNOSIS — I4891 Unspecified atrial fibrillation: Secondary | ICD-10-CM | POA: Diagnosis not present

## 2017-12-02 DIAGNOSIS — E559 Vitamin D deficiency, unspecified: Secondary | ICD-10-CM | POA: Diagnosis not present

## 2017-12-02 DIAGNOSIS — E7800 Pure hypercholesterolemia, unspecified: Secondary | ICD-10-CM | POA: Diagnosis not present

## 2017-12-02 DIAGNOSIS — I10 Essential (primary) hypertension: Secondary | ICD-10-CM | POA: Diagnosis not present

## 2017-12-02 DIAGNOSIS — R69 Illness, unspecified: Secondary | ICD-10-CM | POA: Diagnosis not present

## 2017-12-02 DIAGNOSIS — Z7901 Long term (current) use of anticoagulants: Secondary | ICD-10-CM | POA: Diagnosis not present

## 2017-12-02 DIAGNOSIS — R7301 Impaired fasting glucose: Secondary | ICD-10-CM | POA: Diagnosis not present

## 2017-12-15 DIAGNOSIS — I480 Paroxysmal atrial fibrillation: Secondary | ICD-10-CM | POA: Diagnosis not present

## 2018-01-11 DIAGNOSIS — R69 Illness, unspecified: Secondary | ICD-10-CM | POA: Diagnosis not present

## 2018-01-12 DIAGNOSIS — C189 Malignant neoplasm of colon, unspecified: Secondary | ICD-10-CM | POA: Insufficient documentation

## 2018-01-20 DIAGNOSIS — I480 Paroxysmal atrial fibrillation: Secondary | ICD-10-CM | POA: Diagnosis not present

## 2018-01-20 DIAGNOSIS — I340 Nonrheumatic mitral (valve) insufficiency: Secondary | ICD-10-CM | POA: Diagnosis not present

## 2018-01-21 DIAGNOSIS — I10 Essential (primary) hypertension: Secondary | ICD-10-CM | POA: Diagnosis not present

## 2018-01-21 DIAGNOSIS — R32 Unspecified urinary incontinence: Secondary | ICD-10-CM | POA: Diagnosis not present

## 2018-01-21 DIAGNOSIS — Z7901 Long term (current) use of anticoagulants: Secondary | ICD-10-CM | POA: Diagnosis not present

## 2018-01-21 DIAGNOSIS — I4891 Unspecified atrial fibrillation: Secondary | ICD-10-CM | POA: Diagnosis not present

## 2018-01-21 DIAGNOSIS — E7800 Pure hypercholesterolemia, unspecified: Secondary | ICD-10-CM | POA: Diagnosis not present

## 2018-01-21 DIAGNOSIS — E669 Obesity, unspecified: Secondary | ICD-10-CM | POA: Diagnosis not present

## 2018-01-21 DIAGNOSIS — R69 Illness, unspecified: Secondary | ICD-10-CM | POA: Diagnosis not present

## 2018-01-21 DIAGNOSIS — H3530 Unspecified macular degeneration: Secondary | ICD-10-CM | POA: Diagnosis not present

## 2018-01-21 DIAGNOSIS — Z809 Family history of malignant neoplasm, unspecified: Secondary | ICD-10-CM | POA: Diagnosis not present

## 2018-01-21 DIAGNOSIS — Z6833 Body mass index (BMI) 33.0-33.9, adult: Secondary | ICD-10-CM | POA: Diagnosis not present

## 2018-02-10 ENCOUNTER — Other Ambulatory Visit: Payer: Self-pay | Admitting: Family Medicine

## 2018-02-10 DIAGNOSIS — Z1231 Encounter for screening mammogram for malignant neoplasm of breast: Secondary | ICD-10-CM

## 2018-02-11 ENCOUNTER — Encounter: Payer: Self-pay | Admitting: Gastroenterology

## 2018-02-17 DIAGNOSIS — R7301 Impaired fasting glucose: Secondary | ICD-10-CM | POA: Diagnosis not present

## 2018-02-17 DIAGNOSIS — E559 Vitamin D deficiency, unspecified: Secondary | ICD-10-CM | POA: Diagnosis not present

## 2018-02-17 DIAGNOSIS — E7800 Pure hypercholesterolemia, unspecified: Secondary | ICD-10-CM | POA: Diagnosis not present

## 2018-02-17 DIAGNOSIS — I10 Essential (primary) hypertension: Secondary | ICD-10-CM | POA: Diagnosis not present

## 2018-02-17 DIAGNOSIS — I4891 Unspecified atrial fibrillation: Secondary | ICD-10-CM | POA: Diagnosis not present

## 2018-02-17 DIAGNOSIS — Z7901 Long term (current) use of anticoagulants: Secondary | ICD-10-CM | POA: Diagnosis not present

## 2018-03-01 DIAGNOSIS — I480 Paroxysmal atrial fibrillation: Secondary | ICD-10-CM | POA: Diagnosis not present

## 2018-03-01 DIAGNOSIS — I340 Nonrheumatic mitral (valve) insufficiency: Secondary | ICD-10-CM | POA: Diagnosis not present

## 2018-03-01 DIAGNOSIS — Z7901 Long term (current) use of anticoagulants: Secondary | ICD-10-CM | POA: Diagnosis not present

## 2018-03-02 DIAGNOSIS — Z79899 Other long term (current) drug therapy: Secondary | ICD-10-CM | POA: Diagnosis not present

## 2018-03-02 DIAGNOSIS — Z885 Allergy status to narcotic agent status: Secondary | ICD-10-CM | POA: Diagnosis not present

## 2018-03-02 DIAGNOSIS — Z888 Allergy status to other drugs, medicaments and biological substances status: Secondary | ICD-10-CM | POA: Diagnosis not present

## 2018-03-02 DIAGNOSIS — I481 Persistent atrial fibrillation: Secondary | ICD-10-CM | POA: Diagnosis not present

## 2018-03-02 DIAGNOSIS — I480 Paroxysmal atrial fibrillation: Secondary | ICD-10-CM | POA: Diagnosis not present

## 2018-03-02 DIAGNOSIS — Z7901 Long term (current) use of anticoagulants: Secondary | ICD-10-CM | POA: Diagnosis not present

## 2018-03-02 DIAGNOSIS — R69 Illness, unspecified: Secondary | ICD-10-CM | POA: Diagnosis not present

## 2018-03-02 DIAGNOSIS — I4891 Unspecified atrial fibrillation: Secondary | ICD-10-CM | POA: Diagnosis not present

## 2018-03-02 DIAGNOSIS — R9431 Abnormal electrocardiogram [ECG] [EKG]: Secondary | ICD-10-CM | POA: Diagnosis not present

## 2018-03-02 DIAGNOSIS — R0602 Shortness of breath: Secondary | ICD-10-CM | POA: Diagnosis not present

## 2018-03-02 DIAGNOSIS — I10 Essential (primary) hypertension: Secondary | ICD-10-CM | POA: Diagnosis not present

## 2018-03-02 DIAGNOSIS — I482 Chronic atrial fibrillation: Secondary | ICD-10-CM | POA: Diagnosis not present

## 2018-03-02 DIAGNOSIS — Z5181 Encounter for therapeutic drug level monitoring: Secondary | ICD-10-CM | POA: Diagnosis not present

## 2018-03-02 DIAGNOSIS — I340 Nonrheumatic mitral (valve) insufficiency: Secondary | ICD-10-CM | POA: Diagnosis not present

## 2018-03-02 DIAGNOSIS — Z880 Allergy status to penicillin: Secondary | ICD-10-CM | POA: Diagnosis not present

## 2018-03-02 DIAGNOSIS — Z8701 Personal history of pneumonia (recurrent): Secondary | ICD-10-CM | POA: Diagnosis not present

## 2018-03-10 DIAGNOSIS — I4891 Unspecified atrial fibrillation: Secondary | ICD-10-CM | POA: Diagnosis not present

## 2018-03-10 DIAGNOSIS — R5381 Other malaise: Secondary | ICD-10-CM | POA: Diagnosis not present

## 2018-03-10 DIAGNOSIS — R110 Nausea: Secondary | ICD-10-CM | POA: Diagnosis not present

## 2018-03-25 ENCOUNTER — Ambulatory Visit: Payer: Medicare HMO

## 2018-03-30 ENCOUNTER — Ambulatory Visit: Payer: Medicare HMO

## 2018-04-12 DIAGNOSIS — I480 Paroxysmal atrial fibrillation: Secondary | ICD-10-CM | POA: Diagnosis not present

## 2018-04-12 DIAGNOSIS — I4581 Long QT syndrome: Secondary | ICD-10-CM | POA: Diagnosis not present

## 2018-04-13 ENCOUNTER — Ambulatory Visit (INDEPENDENT_AMBULATORY_CARE_PROVIDER_SITE_OTHER): Payer: Medicare HMO

## 2018-04-13 DIAGNOSIS — Z1231 Encounter for screening mammogram for malignant neoplasm of breast: Secondary | ICD-10-CM | POA: Diagnosis not present

## 2018-04-21 DIAGNOSIS — R748 Abnormal levels of other serum enzymes: Secondary | ICD-10-CM | POA: Diagnosis not present

## 2018-05-05 DIAGNOSIS — R0602 Shortness of breath: Secondary | ICD-10-CM | POA: Diagnosis not present

## 2018-05-05 DIAGNOSIS — R002 Palpitations: Secondary | ICD-10-CM | POA: Diagnosis not present

## 2018-05-05 DIAGNOSIS — I4892 Unspecified atrial flutter: Secondary | ICD-10-CM | POA: Diagnosis not present

## 2018-05-05 DIAGNOSIS — R9431 Abnormal electrocardiogram [ECG] [EKG]: Secondary | ICD-10-CM | POA: Diagnosis not present

## 2018-05-05 DIAGNOSIS — I480 Paroxysmal atrial fibrillation: Secondary | ICD-10-CM | POA: Diagnosis not present

## 2018-05-05 DIAGNOSIS — R5383 Other fatigue: Secondary | ICD-10-CM | POA: Diagnosis not present

## 2018-05-05 DIAGNOSIS — Z8679 Personal history of other diseases of the circulatory system: Secondary | ICD-10-CM | POA: Diagnosis not present

## 2018-05-13 DIAGNOSIS — G251 Drug-induced tremor: Secondary | ICD-10-CM | POA: Diagnosis not present

## 2018-05-13 DIAGNOSIS — T462X5A Adverse effect of other antidysrhythmic drugs, initial encounter: Secondary | ICD-10-CM | POA: Diagnosis not present

## 2018-05-13 DIAGNOSIS — I480 Paroxysmal atrial fibrillation: Secondary | ICD-10-CM | POA: Diagnosis not present

## 2018-05-13 DIAGNOSIS — Z79899 Other long term (current) drug therapy: Secondary | ICD-10-CM | POA: Diagnosis not present

## 2018-05-13 DIAGNOSIS — Z885 Allergy status to narcotic agent status: Secondary | ICD-10-CM | POA: Diagnosis not present

## 2018-05-13 DIAGNOSIS — Z7901 Long term (current) use of anticoagulants: Secondary | ICD-10-CM | POA: Diagnosis not present

## 2018-05-13 DIAGNOSIS — Z880 Allergy status to penicillin: Secondary | ICD-10-CM | POA: Diagnosis not present

## 2018-05-13 DIAGNOSIS — R110 Nausea: Secondary | ICD-10-CM | POA: Diagnosis not present

## 2018-05-13 DIAGNOSIS — I4892 Unspecified atrial flutter: Secondary | ICD-10-CM | POA: Diagnosis not present

## 2018-05-13 DIAGNOSIS — I10 Essential (primary) hypertension: Secondary | ICD-10-CM | POA: Diagnosis not present

## 2018-06-13 DIAGNOSIS — Z7901 Long term (current) use of anticoagulants: Secondary | ICD-10-CM | POA: Diagnosis not present

## 2018-06-13 DIAGNOSIS — E7800 Pure hypercholesterolemia, unspecified: Secondary | ICD-10-CM | POA: Diagnosis not present

## 2018-06-13 DIAGNOSIS — I10 Essential (primary) hypertension: Secondary | ICD-10-CM | POA: Diagnosis not present

## 2018-06-13 DIAGNOSIS — R5381 Other malaise: Secondary | ICD-10-CM | POA: Diagnosis not present

## 2018-06-13 DIAGNOSIS — Z79899 Other long term (current) drug therapy: Secondary | ICD-10-CM | POA: Diagnosis not present

## 2018-06-13 DIAGNOSIS — Z23 Encounter for immunization: Secondary | ICD-10-CM | POA: Diagnosis not present

## 2018-06-13 DIAGNOSIS — E559 Vitamin D deficiency, unspecified: Secondary | ICD-10-CM | POA: Diagnosis not present

## 2018-06-13 DIAGNOSIS — R7301 Impaired fasting glucose: Secondary | ICD-10-CM | POA: Diagnosis not present

## 2018-07-18 DIAGNOSIS — R7301 Impaired fasting glucose: Secondary | ICD-10-CM | POA: Diagnosis not present

## 2018-07-18 DIAGNOSIS — E7800 Pure hypercholesterolemia, unspecified: Secondary | ICD-10-CM | POA: Diagnosis not present

## 2018-07-18 DIAGNOSIS — E559 Vitamin D deficiency, unspecified: Secondary | ICD-10-CM | POA: Diagnosis not present

## 2018-07-18 DIAGNOSIS — Z7901 Long term (current) use of anticoagulants: Secondary | ICD-10-CM | POA: Diagnosis not present

## 2018-07-18 DIAGNOSIS — M79671 Pain in right foot: Secondary | ICD-10-CM | POA: Diagnosis not present

## 2018-07-18 DIAGNOSIS — R0789 Other chest pain: Secondary | ICD-10-CM | POA: Diagnosis not present

## 2018-07-18 DIAGNOSIS — I10 Essential (primary) hypertension: Secondary | ICD-10-CM | POA: Diagnosis not present

## 2018-08-15 DIAGNOSIS — I2510 Atherosclerotic heart disease of native coronary artery without angina pectoris: Secondary | ICD-10-CM | POA: Diagnosis not present

## 2018-08-15 DIAGNOSIS — I480 Paroxysmal atrial fibrillation: Secondary | ICD-10-CM | POA: Diagnosis not present

## 2018-08-15 DIAGNOSIS — I517 Cardiomegaly: Secondary | ICD-10-CM | POA: Diagnosis not present

## 2018-08-15 DIAGNOSIS — I700 Atherosclerosis of aorta: Secondary | ICD-10-CM | POA: Diagnosis not present

## 2018-08-24 DIAGNOSIS — F419 Anxiety disorder, unspecified: Secondary | ICD-10-CM | POA: Diagnosis not present

## 2018-08-24 DIAGNOSIS — I10 Essential (primary) hypertension: Secondary | ICD-10-CM | POA: Diagnosis not present

## 2018-08-24 DIAGNOSIS — I484 Atypical atrial flutter: Secondary | ICD-10-CM | POA: Diagnosis not present

## 2018-08-24 DIAGNOSIS — I480 Paroxysmal atrial fibrillation: Secondary | ICD-10-CM | POA: Diagnosis not present

## 2018-08-24 DIAGNOSIS — I340 Nonrheumatic mitral (valve) insufficiency: Secondary | ICD-10-CM | POA: Diagnosis not present

## 2018-08-24 DIAGNOSIS — D329 Benign neoplasm of meninges, unspecified: Secondary | ICD-10-CM | POA: Diagnosis not present

## 2018-08-24 DIAGNOSIS — J309 Allergic rhinitis, unspecified: Secondary | ICD-10-CM | POA: Diagnosis not present

## 2018-08-24 DIAGNOSIS — R69 Illness, unspecified: Secondary | ICD-10-CM | POA: Diagnosis not present

## 2018-08-24 DIAGNOSIS — K219 Gastro-esophageal reflux disease without esophagitis: Secondary | ICD-10-CM | POA: Diagnosis not present

## 2018-08-24 DIAGNOSIS — Z7901 Long term (current) use of anticoagulants: Secondary | ICD-10-CM | POA: Diagnosis not present

## 2018-08-24 DIAGNOSIS — I4891 Unspecified atrial fibrillation: Secondary | ICD-10-CM | POA: Diagnosis not present

## 2018-08-25 DIAGNOSIS — I10 Essential (primary) hypertension: Secondary | ICD-10-CM | POA: Diagnosis not present

## 2018-08-25 DIAGNOSIS — I440 Atrioventricular block, first degree: Secondary | ICD-10-CM | POA: Diagnosis not present

## 2018-08-25 DIAGNOSIS — R9431 Abnormal electrocardiogram [ECG] [EKG]: Secondary | ICD-10-CM | POA: Diagnosis not present

## 2018-08-25 DIAGNOSIS — I480 Paroxysmal atrial fibrillation: Secondary | ICD-10-CM | POA: Diagnosis not present

## 2018-11-15 IMAGING — DX DG CHEST 1V PORT
1 series · 1 of 1 positions shown · non-contrast
Comparison: Radiographs March 15, 2014.

CLINICAL DATA: Chest pain.

EXAM:
PORTABLE CHEST 1 VIEW

[chest ap]
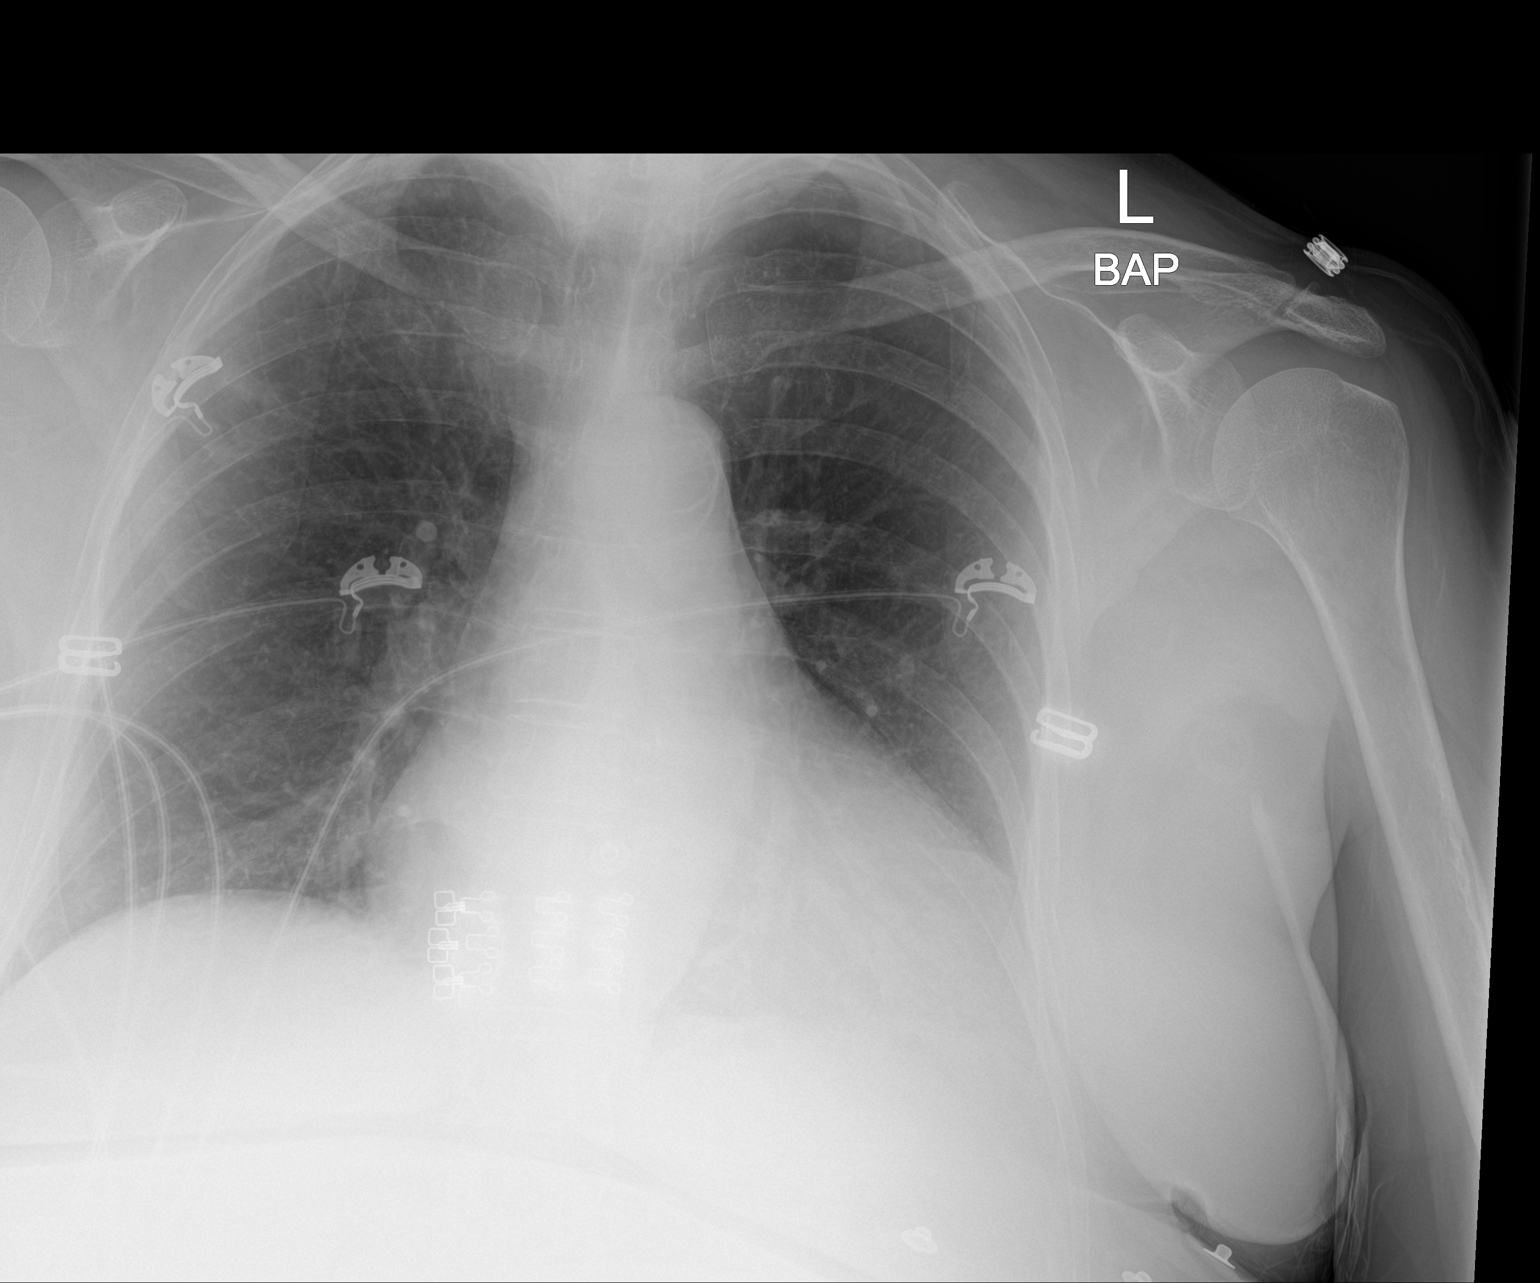

[1 of 1 positions shown; findings below may reference images not displayed]

FINDINGS: Stable cardiomegaly. Atherosclerosis of thoracic aorta is noted. No
pneumothorax or pleural effusion is noted. No acute pulmonary
disease is noted. Bony thorax is unremarkable.
IMPRESSION: No acute cardiopulmonary abnormality seen.  Aortic atherosclerosis.
